# Patient Record
Sex: Female | Born: 1945 | Race: White | Hispanic: No | Smoking: Never smoker
Health system: Southern US, Community
[De-identification: ages and names within clinical notes are randomized; demographics above are authoritative.]

## PROBLEM LIST (undated history)

## (undated) DIAGNOSIS — R7303 Prediabetes: Secondary | ICD-10-CM

## (undated) DIAGNOSIS — O360190 Maternal care for anti-D [Rh] antibodies, unspecified trimester, not applicable or unspecified: Secondary | ICD-10-CM

## (undated) DIAGNOSIS — I10 Essential (primary) hypertension: Secondary | ICD-10-CM

## (undated) DIAGNOSIS — F32A Depression, unspecified: Secondary | ICD-10-CM

## (undated) DIAGNOSIS — F329 Major depressive disorder, single episode, unspecified: Secondary | ICD-10-CM

## (undated) DIAGNOSIS — B019 Varicella without complication: Secondary | ICD-10-CM

## (undated) DIAGNOSIS — E785 Hyperlipidemia, unspecified: Secondary | ICD-10-CM

## (undated) DIAGNOSIS — F419 Anxiety disorder, unspecified: Secondary | ICD-10-CM

## (undated) DIAGNOSIS — Z5189 Encounter for other specified aftercare: Secondary | ICD-10-CM

## (undated) DIAGNOSIS — B029 Zoster without complications: Secondary | ICD-10-CM

## (undated) DIAGNOSIS — K220 Achalasia of cardia: Secondary | ICD-10-CM

## (undated) DIAGNOSIS — K635 Polyp of colon: Secondary | ICD-10-CM

## (undated) DIAGNOSIS — M8580 Other specified disorders of bone density and structure, unspecified site: Secondary | ICD-10-CM

## (undated) DIAGNOSIS — K219 Gastro-esophageal reflux disease without esophagitis: Secondary | ICD-10-CM

## (undated) HISTORY — DX: Maternal care for anti-d (rh) antibodies, unspecified trimester, not applicable or unspecified: O36.0190

## (undated) HISTORY — DX: Anxiety disorder, unspecified: F41.9

## (undated) HISTORY — DX: Polyp of colon: K63.5

## (undated) HISTORY — DX: Achalasia of cardia: K22.0

## (undated) HISTORY — DX: Depression, unspecified: F32.A

## (undated) HISTORY — DX: Essential (primary) hypertension: I10

## (undated) HISTORY — DX: Encounter for other specified aftercare: Z51.89

## (undated) HISTORY — DX: Prediabetes: R73.03

## (undated) HISTORY — PX: THROAT SURGERY: SHX803

## (undated) HISTORY — DX: Gastro-esophageal reflux disease without esophagitis: K21.9

## (undated) HISTORY — DX: Other specified disorders of bone density and structure, unspecified site: M85.80

## (undated) HISTORY — DX: Zoster without complications: B02.9

## (undated) HISTORY — DX: Hyperlipidemia, unspecified: E78.5

## (undated) HISTORY — PX: WRIST SURGERY: SHX841

## (undated) HISTORY — PX: COLON SURGERY: SHX602

## (undated) HISTORY — DX: Varicella without complication: B01.9

## (undated) HISTORY — DX: Major depressive disorder, single episode, unspecified: F32.9

---

## 1998-06-29 ENCOUNTER — Other Ambulatory Visit: Admission: RE | Admit: 1998-06-29 | Discharge: 1998-06-29 | Payer: Self-pay | Admitting: Internal Medicine

## 2000-10-01 ENCOUNTER — Encounter: Payer: Self-pay | Admitting: Internal Medicine

## 2000-10-01 ENCOUNTER — Encounter: Admission: RE | Admit: 2000-10-01 | Discharge: 2000-10-01 | Payer: Self-pay | Admitting: Internal Medicine

## 2001-08-20 ENCOUNTER — Other Ambulatory Visit: Admission: RE | Admit: 2001-08-20 | Discharge: 2001-08-20 | Payer: Self-pay | Admitting: Internal Medicine

## 2012-03-14 DIAGNOSIS — H6120 Impacted cerumen, unspecified ear: Secondary | ICD-10-CM | POA: Diagnosis not present

## 2012-03-14 DIAGNOSIS — J019 Acute sinusitis, unspecified: Secondary | ICD-10-CM | POA: Diagnosis not present

## 2012-05-04 DIAGNOSIS — H2510 Age-related nuclear cataract, unspecified eye: Secondary | ICD-10-CM | POA: Diagnosis not present

## 2012-05-27 ENCOUNTER — Other Ambulatory Visit (HOSPITAL_BASED_OUTPATIENT_CLINIC_OR_DEPARTMENT_OTHER): Payer: Self-pay | Admitting: Family Medicine

## 2012-05-27 ENCOUNTER — Ambulatory Visit (HOSPITAL_BASED_OUTPATIENT_CLINIC_OR_DEPARTMENT_OTHER)
Admission: RE | Admit: 2012-05-27 | Discharge: 2012-05-27 | Disposition: A | Discharge status: Home / Self Care | Payer: Medicare Other | Source: Ambulatory Visit | Attending: Family Medicine | Admitting: Family Medicine

## 2012-05-27 DIAGNOSIS — M79609 Pain in unspecified limb: Secondary | ICD-10-CM | POA: Diagnosis not present

## 2012-05-27 DIAGNOSIS — S43499A Other sprain of unspecified shoulder joint, initial encounter: Secondary | ICD-10-CM

## 2012-05-27 DIAGNOSIS — R52 Pain, unspecified: Secondary | ICD-10-CM

## 2012-05-27 DIAGNOSIS — M25619 Stiffness of unspecified shoulder, not elsewhere classified: Secondary | ICD-10-CM | POA: Insufficient documentation

## 2012-05-27 DIAGNOSIS — S46819A Strain of other muscles, fascia and tendons at shoulder and upper arm level, unspecified arm, initial encounter: Secondary | ICD-10-CM

## 2012-05-27 DIAGNOSIS — M25519 Pain in unspecified shoulder: Secondary | ICD-10-CM | POA: Diagnosis not present

## 2012-05-28 ENCOUNTER — Ambulatory Visit (INDEPENDENT_AMBULATORY_CARE_PROVIDER_SITE_OTHER): Payer: Medicare Other | Admitting: Family Medicine

## 2012-05-28 ENCOUNTER — Encounter: Payer: Self-pay | Admitting: Family Medicine

## 2012-05-28 VITALS — BP 109/75 | HR 88 | Temp 98.4°F | Ht 62.0 in | Wt 146.0 lb

## 2012-05-28 DIAGNOSIS — M25519 Pain in unspecified shoulder: Secondary | ICD-10-CM

## 2012-05-28 DIAGNOSIS — M25512 Pain in left shoulder: Secondary | ICD-10-CM | POA: Insufficient documentation

## 2012-05-28 NOTE — Progress Notes (Signed)
  Subjective:    Patient ID: Brandyn Thien, female    DOB: 1946-02-25, 66 y.o.   MRN: [id number]  PCP: Dr. Alberteen Sam  HPI 66 yo F here for left shoulder pain.  Patient denies known injury. She states for 3-4 months she has had insidious onset with worsening left shoulder pain. Worse with any motions especially reaching behind and overhead. Pain primarily lateral upper arm/shoulder. X-rays showed moderate AC DJD but otherwise normal. Some night pain. No numbness or tingling. No neck pain. Has tried tylenol, aleve, lidoderm, piroxicam, voltaren gel. Has not tried PT, cortisone injection. No prior left shoulder issues. Is right handed.  Past Medical History  Diagnosis Date  . Hypertension   . Hyperlipidemia     Current Outpatient Prescriptions on File Prior to Visit  Medication Sig Dispense Refill  . DULoxetine (CYMBALTA) 60 MG capsule Take 60 mg by mouth daily.      Marland Kitchen ezetimibe (ZETIA) 10 MG tablet Take 10 mg by mouth daily.      Marland Kitchen lisinopril (PRINIVIL,ZESTRIL) 20 MG tablet Take 20 mg by mouth daily.        Past Surgical History  Procedure Date  . Colon surgery   . Wrist surgery   . Throat surgery     No Known Allergies  History   Social History  . Marital Status: Married    Spouse Name: N/A    Number of Children: N/A  . Years of Education: N/A   Occupational History  . Not on file.   Social History Main Topics  . Smoking status: Never Smoker   . Smokeless tobacco: Not on file  . Alcohol Use: Not on file  . Drug Use: Not on file  . Sexually Active: Not on file   Other Topics Concern  . Not on file   Social History Narrative  . No narrative on file    Family History  Problem Relation Age of Onset  . Hyperlipidemia Mother   . Hypertension Mother   . Diabetes Neg Hx   . Heart attack Neg Hx   . Sudden death Neg Hx     BP 109/75  Pulse 88  Temp(Src) 98.4 F (36.9 C) (Oral)  Ht 5\' 2"  (1.575 m)  Wt 146 lb (66.225 kg)  BMI 26.70 kg/m2  Review of  Systems See HPI above.    Objective:   Physical Exam Gen: NAD  L shoulder: No swelling, ecchymoses.  No gross deformity. No TTP at [facility] joint or biceps tendon. FROM with + painful arc. Positive Hawkins, Neers. Negative Speeds, Yergasons. Negative drop arm test. Strength 5-/5 with empty can and 5/5 with resisted internal/external rotation - pain with empty can. Negative apprehension. NV intact distally.  R shoulder: FROM without pain or weakness.    Assessment & Plan:  1. Left shoulder pain - most likely due to rotator cuff tendinopathy or partial tear.  No known injury and negative drop arm test - highly unlikely she has a complete tear.  She is scheduled for an MRI on Saturday - advised her to go ahead with this and I will call her with results on Monday.  Likely treatment options include PT +/- subacromial injection but will be dictated by MRI results.  Continue current medications in meantime, icing as needed, avoid overhead and reaching activities.

## 2012-05-28 NOTE — Assessment & Plan Note (Signed)
most likely due to rotator cuff tendinopathy or partial tear.  No known injury and negative drop arm test - highly unlikely she has a complete tear.  She is scheduled for an MRI on Saturday - advised her to go ahead with this and I will call her with results on Monday.  Likely treatment options include PT +/- subacromial injection but will be dictated by MRI results.  Continue current medications in meantime, icing as needed, avoid overhead and reaching activities.

## 2012-05-30 ENCOUNTER — Ambulatory Visit (HOSPITAL_BASED_OUTPATIENT_CLINIC_OR_DEPARTMENT_OTHER)
Admission: RE | Admit: 2012-05-30 | Discharge: 2012-05-30 | Disposition: A | Discharge status: Home / Self Care | Payer: Medicare Other | Source: Ambulatory Visit | Attending: Family Medicine | Admitting: Family Medicine

## 2012-05-30 DIAGNOSIS — M75100 Unspecified rotator cuff tear or rupture of unspecified shoulder, not specified as traumatic: Secondary | ICD-10-CM | POA: Insufficient documentation

## 2012-05-30 DIAGNOSIS — IMO0002 Reserved for concepts with insufficient information to code with codable children: Secondary | ICD-10-CM | POA: Insufficient documentation

## 2012-05-30 DIAGNOSIS — M25519 Pain in unspecified shoulder: Secondary | ICD-10-CM | POA: Insufficient documentation

## 2012-05-30 DIAGNOSIS — M25619 Stiffness of unspecified shoulder, not elsewhere classified: Secondary | ICD-10-CM | POA: Diagnosis not present

## 2012-05-30 DIAGNOSIS — R52 Pain, unspecified: Secondary | ICD-10-CM

## 2012-06-08 ENCOUNTER — Encounter: Payer: Self-pay | Admitting: Family Medicine

## 2012-06-08 ENCOUNTER — Ambulatory Visit (INDEPENDENT_AMBULATORY_CARE_PROVIDER_SITE_OTHER): Payer: Self-pay | Admitting: Family Medicine

## 2012-06-08 VITALS — BP 116/78 | HR 83 | Temp 98.5°F | Ht 63.0 in | Wt 146.0 lb

## 2012-06-08 DIAGNOSIS — M25519 Pain in unspecified shoulder: Secondary | ICD-10-CM | POA: Diagnosis not present

## 2012-06-08 DIAGNOSIS — M25512 Pain in left shoulder: Secondary | ICD-10-CM

## 2012-06-08 NOTE — Assessment & Plan Note (Signed)
2/2 rotator cuff tendinopathy and subacromial bursitis.  Subacromial injection given today and taught patient HEP with theraband and scapular stabilization exercises.  F/u in 4-6 weeks for reevaluation.  If not improving as expected can consider formal PT, nitro patches, repeat injection.

## 2012-06-08 NOTE — Progress Notes (Signed)
Subjective:    Patient ID: Kelsey Beasley, female    DOB: [birth year], 66 y.o.   MRN: [id number]  PCP: Dr. Alberteen Sam  HPI  66 yo F here for f/u left shoulder pain.  5/30: Patient denies known injury. She states for 3-4 months she has had insidious onset with worsening left shoulder pain. Worse with any motions especially reaching behind and overhead. Pain primarily lateral upper arm/shoulder. X-rays showed moderate AC DJD but otherwise normal. Some night pain. No numbness or tingling. No neck pain. Has tried tylenol, aleve, lidoderm, piroxicam, voltaren gel. Has not tried PT, cortisone injection. No prior left shoulder issues. Is right handed.  6/10: Patient returns today for subacromial cortisone injection. MRI showed rotator cuff tendinopathy and subacromial bursitis. She also would like to do a home exercise program instead of formal PT.  Past Medical History  Diagnosis Date  . Hypertension   . Hyperlipidemia     Current Outpatient Prescriptions on File Prior to Visit  Medication Sig Dispense Refill  . DULoxetine (CYMBALTA) 60 MG capsule Take 60 mg by mouth daily.      Marland Kitchen ezetimibe (ZETIA) 10 MG tablet Take 10 mg by mouth daily.      Marland Kitchen lisinopril (PRINIVIL,ZESTRIL) 20 MG tablet Take 20 mg by mouth daily.        Past Surgical History  Procedure Date  . Colon surgery   . Wrist surgery   . Throat surgery     No Known Allergies  History   Social History  . Marital Status: Married    Spouse Name: N/A    Number of Children: N/A  . Years of Education: N/A   Occupational History  . Not on file.   Social History Main Topics  . Smoking status: Never Smoker   . Smokeless tobacco: Not on file  . Alcohol Use: Not on file  . Drug Use: Not on file  . Sexually Active: Not on file   Other Topics Concern  . Not on file   Social History Narrative  . No narrative on file    Family History  Problem Relation Age of Onset  . Hyperlipidemia Mother   . Hypertension  Mother   . Diabetes Neg Hx   . Heart attack Neg Hx   . Sudden death Neg Hx     BP 116/78  Pulse 83  Temp(Src) 98.5 F (36.9 C) (Oral)  Ht 5\' 3"  (1.6 m)  Wt 146 lb (66.225 kg)  BMI 25.86 kg/m2  Review of Systems  See HPI above.    Objective:   Physical Exam ** Exam not repeated today** Gen: NAD  L shoulder: No swelling, ecchymoses.  No gross deformity. No TTP at [facility] joint or biceps tendon. FROM with + painful arc. Positive Hawkins, Neers. Negative Speeds, Yergasons. Negative drop arm test. Strength 5-/5 with empty can and 5/5 with resisted internal/external rotation - pain with empty can. Negative apprehension. NV intact distally.  R shoulder: FROM without pain or weakness.    Assessment & Plan:  1. Left shoulder pain - 2/2 rotator cuff tendinopathy and subacromial bursitis.  Subacromial injection given today and taught patient HEP with theraband and scapular stabilization exercises.  F/u in 4-6 weeks for reevaluation.  If not improving as expected can consider formal PT, nitro patches, repeat injection.  After informed written consent, patient was seated on exam table. Left shoulder was prepped with alcohol swab and utilizing posterior approach, patient's left subacromial space was injected with 3:1 marcaine: depomedrol.  Patient tolerated the procedure well without immediate complications.

## 2012-07-10 DIAGNOSIS — E785 Hyperlipidemia, unspecified: Secondary | ICD-10-CM | POA: Diagnosis not present

## 2012-07-10 DIAGNOSIS — R232 Flushing: Secondary | ICD-10-CM | POA: Diagnosis not present

## 2012-07-10 DIAGNOSIS — I10 Essential (primary) hypertension: Secondary | ICD-10-CM | POA: Diagnosis not present

## 2012-07-10 DIAGNOSIS — K219 Gastro-esophageal reflux disease without esophagitis: Secondary | ICD-10-CM | POA: Diagnosis not present

## 2012-07-13 ENCOUNTER — Ambulatory Visit (INDEPENDENT_AMBULATORY_CARE_PROVIDER_SITE_OTHER): Payer: Medicare Other | Admitting: Family Medicine

## 2012-07-13 ENCOUNTER — Encounter: Payer: Self-pay | Admitting: Family Medicine

## 2012-07-13 VITALS — BP 126/84 | HR 73 | Temp 98.0°F | Ht 63.0 in | Wt 146.0 lb

## 2012-07-13 DIAGNOSIS — M25512 Pain in left shoulder: Secondary | ICD-10-CM

## 2012-07-13 DIAGNOSIS — M25519 Pain in unspecified shoulder: Secondary | ICD-10-CM | POA: Diagnosis not present

## 2012-07-13 MED ORDER — NITROGLYCERIN 0.2 MG/HR TD PT24: MEDICATED_PATCH | TRANSDERMAL | Status: DC

## 2012-07-14 ENCOUNTER — Encounter: Payer: Self-pay | Admitting: Family Medicine

## 2012-07-14 NOTE — Progress Notes (Signed)
Subjective:    Patient ID: Kelsey Beasley, female    DOB: 11-21-1946, 66 y.o.   MRN: [id number]  PCP: Dr. Alberteen Sam  HPI  66 yo F here for f/u left shoulder pain.  5/30: Patient denies known injury. She states for 3-4 months she has had insidious onset with worsening left shoulder pain. Worse with any motions especially reaching behind and overhead. Pain primarily lateral upper arm/shoulder. X-rays showed moderate AC DJD but otherwise normal. Some night pain. No numbness or tingling. No neck pain. Has tried tylenol, aleve, lidoderm, piroxicam, voltaren gel. Has not tried PT, cortisone injection. No prior left shoulder issues. Is right handed.  6/10: Patient returns today for subacromial cortisone injection. MRI showed rotator cuff tendinopathy and subacromial bursitis. She also would like to do a home exercise program instead of formal PT.  7/16: Patient reports overall she has improved since last visit. Shot helped with her pain and motion. Pain now comes and goes, bothers if she lifts it the wrong way. Worse with overhead motions. Continues with home exercises. Taking aleve as needed.  Past Medical History  Diagnosis Date  . Hypertension   . Hyperlipidemia     Current Outpatient Prescriptions on File Prior to Visit  Medication Sig Dispense Refill  . DULoxetine (CYMBALTA) 60 MG capsule Take 60 mg by mouth daily.      Marland Kitchen ezetimibe (ZETIA) 10 MG tablet Take 10 mg by mouth daily.      Marland Kitchen lisinopril (PRINIVIL,ZESTRIL) 20 MG tablet Take 20 mg by mouth daily.      . nitroGLYCERIN (NITRODUR - DOSED IN MG/24 HR) 0.2 mg/hr Apply 1/4th of a patch to left shoulder for rotator cuff tendinopathy  - change daily  30 patch  11    Past Surgical History  Procedure Date  . Colon surgery   . Wrist surgery   . Throat surgery     No Known Allergies  History   Social History  . Marital Status: Married    Spouse Name: N/A    Number of Children: N/A  . Years of Education: N/A    Occupational History  . Not on file.   Social History Main Topics  . Smoking status: Never Smoker   . Smokeless tobacco: Not on file  . Alcohol Use: Not on file  . Drug Use: Not on file  . Sexually Active: Not on file   Other Topics Concern  . Not on file   Social History Narrative  . No narrative on file    Family History  Problem Relation Age of Onset  . Hyperlipidemia Mother   . Hypertension Mother   . Diabetes Neg Hx   . Heart attack Neg Hx   . Sudden death Neg Hx     BP 126/84  Pulse 73  Temp 98 F (36.7 C) (Oral)  Ht 5\' 3"  (1.6 m)  Wt 146 lb (66.225 kg)  BMI 25.86 kg/m2  Review of Systems  See HPI above.    Objective:   Physical Exam Gen: NAD  L shoulder: No swelling, ecchymoses.  No gross deformity. No TTP at [facility] joint or biceps tendon. FROM with + painful arc. Positive Hawkins, Neers. Negative Speeds, Yergasons. Negative drop arm test. Strength 5/5 with empty can and 5/5 with resisted internal/external rotation - pain with empty can, minimal with ext rotation. Negative apprehension. NV intact distally.  R shoulder: FROM without pain or weakness.    Assessment & Plan:  1. Left shoulder pain - 2/2  rotator cuff tendinopathy and subacromial bursitis.  Patient has improved since last visit but still has pain.  We discussed options: continue current HEP and aleve, repeat injection (I believe it's a little early to repeat this), nitro patches, formal PT.  She would like to try nitro patches - script sent in to pharmacy.  F/u in ~6 weeks for reevaluation - call if she develops a headache that causes her to stop nitro.

## 2012-07-14 NOTE — Assessment & Plan Note (Signed)
2/2 rotator cuff tendinopathy and subacromial bursitis.  Patient has improved since last visit but still has pain.  We discussed options: continue current HEP and aleve, repeat injection (I believe it's a little early to repeat this), nitro patches, formal PT.  She would like to try nitro patches - script sent in to pharmacy.  F/u in ~6 weeks for reevaluation - call if she develops a headache that causes her to stop nitro.

## 2012-08-17 ENCOUNTER — Ambulatory Visit: Payer: Medicare Other | Admitting: Family Medicine

## 2012-10-02 DIAGNOSIS — Z1231 Encounter for screening mammogram for malignant neoplasm of breast: Secondary | ICD-10-CM | POA: Diagnosis not present

## 2013-01-06 DIAGNOSIS — Z23 Encounter for immunization: Secondary | ICD-10-CM | POA: Diagnosis not present

## 2013-01-15 DIAGNOSIS — F329 Major depressive disorder, single episode, unspecified: Secondary | ICD-10-CM | POA: Diagnosis not present

## 2013-01-15 DIAGNOSIS — F3289 Other specified depressive episodes: Secondary | ICD-10-CM | POA: Diagnosis not present

## 2013-01-15 DIAGNOSIS — I10 Essential (primary) hypertension: Secondary | ICD-10-CM | POA: Diagnosis not present

## 2013-01-15 DIAGNOSIS — R4589 Other symptoms and signs involving emotional state: Secondary | ICD-10-CM | POA: Diagnosis not present

## 2013-01-15 DIAGNOSIS — E785 Hyperlipidemia, unspecified: Secondary | ICD-10-CM | POA: Diagnosis not present

## 2013-02-03 DIAGNOSIS — Z23 Encounter for immunization: Secondary | ICD-10-CM | POA: Diagnosis not present

## 2013-02-03 DIAGNOSIS — Z9189 Other specified personal risk factors, not elsewhere classified: Secondary | ICD-10-CM | POA: Diagnosis not present

## 2013-02-03 DIAGNOSIS — Z124 Encounter for screening for malignant neoplasm of cervix: Secondary | ICD-10-CM | POA: Diagnosis not present

## 2013-02-03 DIAGNOSIS — Z0000 Encounter for general adult medical examination without abnormal findings: Secondary | ICD-10-CM | POA: Diagnosis not present

## 2013-02-05 DIAGNOSIS — Z79899 Other long term (current) drug therapy: Secondary | ICD-10-CM | POA: Diagnosis not present

## 2013-02-05 DIAGNOSIS — Z0000 Encounter for general adult medical examination without abnormal findings: Secondary | ICD-10-CM | POA: Diagnosis not present

## 2013-02-05 DIAGNOSIS — E559 Vitamin D deficiency, unspecified: Secondary | ICD-10-CM | POA: Diagnosis not present

## 2013-05-06 DIAGNOSIS — H2510 Age-related nuclear cataract, unspecified eye: Secondary | ICD-10-CM | POA: Diagnosis not present

## 2013-07-05 ENCOUNTER — Other Ambulatory Visit: Payer: Self-pay | Admitting: Family Medicine

## 2013-07-20 ENCOUNTER — Other Ambulatory Visit: Payer: Self-pay | Admitting: Family Medicine

## 2013-08-10 ENCOUNTER — Other Ambulatory Visit: Payer: Self-pay | Admitting: *Deleted

## 2013-08-10 MED ORDER — DULOXETINE HCL 60 MG PO CPEP: 60.0000 mg | ORAL_CAPSULE | Freq: Every day | ORAL | Status: DC

## 2013-08-10 NOTE — Telephone Encounter (Signed)
Patient is scheduling an appt within the next 30 days for refills on her meds. PG

## 2013-09-08 ENCOUNTER — Encounter: Payer: Self-pay | Admitting: Family Medicine

## 2013-09-08 ENCOUNTER — Ambulatory Visit (INDEPENDENT_AMBULATORY_CARE_PROVIDER_SITE_OTHER): Payer: Medicare Other | Admitting: Family Medicine

## 2013-09-08 VITALS — BP 122/79 | HR 69 | Resp 16 | Wt 139.0 lb

## 2013-09-08 DIAGNOSIS — M549 Dorsalgia, unspecified: Secondary | ICD-10-CM

## 2013-09-08 DIAGNOSIS — I10 Essential (primary) hypertension: Secondary | ICD-10-CM

## 2013-09-08 DIAGNOSIS — Z23 Encounter for immunization: Secondary | ICD-10-CM

## 2013-09-08 DIAGNOSIS — F411 Generalized anxiety disorder: Secondary | ICD-10-CM

## 2013-09-08 DIAGNOSIS — E559 Vitamin D deficiency, unspecified: Secondary | ICD-10-CM

## 2013-09-08 DIAGNOSIS — K219 Gastro-esophageal reflux disease without esophagitis: Secondary | ICD-10-CM

## 2013-09-08 DIAGNOSIS — E785 Hyperlipidemia, unspecified: Secondary | ICD-10-CM | POA: Diagnosis not present

## 2013-09-08 MED ORDER — EZETIMIBE 10 MG PO TABS: 10.0000 mg | ORAL_TABLET | Freq: Every day | ORAL | Status: DC

## 2013-09-08 MED ORDER — LISINOPRIL-HYDROCHLOROTHIAZIDE 20-12.5 MG PO TABS: 1.0000 | ORAL_TABLET | Freq: Every day | ORAL | Status: DC

## 2013-09-08 MED ORDER — ESCITALOPRAM OXALATE 10 MG PO TABS: 10.0000 mg | ORAL_TABLET | Freq: Every day | ORAL | Status: DC

## 2013-09-08 MED ORDER — RABEPRAZOLE SODIUM 20 MG PO TBEC: 20.0000 mg | DELAYED_RELEASE_TABLET | Freq: Every day | ORAL | Status: DC

## 2013-09-08 MED ORDER — PRAVASTATIN SODIUM 40 MG PO TABS: 40.0000 mg | ORAL_TABLET | Freq: Every day | ORAL | Status: DC

## 2013-09-08 MED ORDER — LIDOCAINE 5 % EX PTCH: MEDICATED_PATCH | CUTANEOUS | Status: DC

## 2013-09-08 MED ORDER — VITAMIN D (ERGOCALCIFEROL) 1.25 MG (50000 UNIT) PO CAPS: 50000.0000 [IU] | ORAL_CAPSULE | ORAL | Status: AC

## 2013-09-08 NOTE — Progress Notes (Signed)
  Subjective:    Patient ID: Kelsey Beasley, female    DOB: 1946-10-08, 67 y.o.   MRN: [id number]  HPI  Kelsey Beasley is here today to discuss the conditions listed below.   1)  GERD:  She continues having acid reflux.  She takes Omeprazole 20 mg twice daily which is not helping her much.    2)  Hypertension:  She has been off her lisinopril/HCTZ 20-12.5 for several weeks.   3)  Hyperlipidemia:  She has not been taking her pravastatin.  She has been trying to eat healthier.    4)  Mood:  She continues to have struggles with her marriage.  She wants to wean herself off Cymbalta due to cost.  She would like to try a different treatment for her mood.    Review of Systems  Constitutional: Negative.   HENT: Negative.   Eyes: Negative.   Respiratory: Negative.   Cardiovascular: Negative.   Gastrointestinal: Negative.   Endocrine: Negative.   Genitourinary: Negative.   Musculoskeletal: Negative.   Skin: Negative.   Allergic/Immunologic: Negative.   Neurological: Negative.   Hematological: Negative.   Psychiatric/Behavioral: Negative.      Past Medical History  Diagnosis Date  . Hypertension   . Hyperlipidemia      Family History  Problem Relation Age of Onset  . Hyperlipidemia Mother   . Hypertension Mother   . Diabetes Neg Hx   . Heart attack Neg Hx   . Sudden death Neg Hx      History   Social History Narrative   Marital Status:  Married Marine scientist)    Children:  [id number]   Pets:  None    Living Situation: Lives with spouse, daughter and grandson.     Occupation: Optometrist   Drug Use:  None   Diet:  Regular   Exercise:  Vanetta Shawl; Walking    Hobbies:  Reading , [place], Grandchildren    [Tobacco: Never smoker]      Objective:   Physical Exam  Vitals reviewed. Constitutional: She is oriented to person, place, and time. She appears well-developed and well-nourished.  Eyes: Conjunctivae are normal. No scleral icterus.  Neck: Neck  supple. No thyromegaly present.  Cardiovascular: Normal rate, regular rhythm and normal heart sounds.   Pulmonary/Chest: Effort normal and breath sounds normal.  Musculoskeletal: She exhibits no edema and no tenderness.  Lymphadenopathy:    She has no cervical adenopathy.  Neurological: She is alert and oriented to person, place, and time.  Skin: Skin is warm and dry.  Psychiatric: She has a normal mood and affect. Her behavior is normal. Judgment and thought content normal.          Assessment & Plan:

## 2013-09-08 NOTE — Patient Instructions (Addendum)
1)  GERD - Try the Aciphex for 1 month to see if you like it better than the omepazole.  If you do let Lynnea Ferrier know and I will send it in to [facility].    2)  Knee - Voltaren Gel with Glucosamine 1500 mg per day.    Patellar Tendinitis, Jumper's Knee with Rehab Tendinitis is inflammation of a tendon. Tendonitis of the tendon below the kneecap (patella) is known as patellar tendonitis. Patellar tendonitis is a common cause of pain below the kneecap (infrapatella). Patellar tendonitis may involve a tear (strain) in the ligament. Strains are classified into three categories. Grade 1 strains cause pain, but the tendon is not lengthened. Grade 2 strains include a lengthened ligament, due to the ligament being stretched or partially ruptured. With grade 2 strains there is still function, although function may be decreased. Grade 3 strains involve a complete tear of the tendon or muscle, and function is usually impaired. Patellar tendon strains are usually grade 1 or 2.  SYMPTOMS   Pain, tenderness, swelling, warmth, or redness over the patellar tendon (just below the kneecap).  Pain and loss of strength (sometimes), with forcefully straightening the knee (especially when jumping or rising from a seated or squatting position), or bending the knee completely (squatting or kneeling).  Crackling sound (crepitation) when the tendon is moved or touched. CAUSES  Patellar tendonitis is caused by injury to the patellar tendon. The inflammation is the body's healing response. Common causes of injury include:  Stress from a sudden increase in intensity, frequency, or duration of training.  Overuse of the quadriceps thigh muscles and patellar tendon.  Direct hit (trauma) to the knee or patellar tendon. RISK INCREASES WITH:  Sports that require sudden, explosive thigh muscle (quadriceps) contraction, such as jumping, quick starts, or kicking.  Running sports, especially running down hills.  Poor  strength and flexibility of the thigh and knee.  Flat feet. PREVENTION  Warm up and stretch properly before activity.  Allow for adequate recovery between workouts.  Maintain physical fitness:  Strength, flexibility, and endurance.  Cardiovascular fitness.  Protect the knee joint with taping, protective strapping, bracing, or elastic compression bandage.  Wear arch supports (orthotics). PROGNOSIS  If treated properly, patellar tendonitis usually heals within 6 weeks.  RELATED COMPLICATIONS   Longer healing time, if not properly treated or if not given enough time to heal.  Recurring symptoms, if activity is resumed too soon, with overuse, with a direct blow, or when using poor technique.  If untreated, tendon rupture requiring surgery. TREATMENT Treatment first involves the use of ice and medicine, to reduce pain and inflammation. The use of strengthening and stretching exercises may help reduce pain with activity. These exercises may be performed at home or with a therapist. Serious cases of tendonitis may require restraining the knee for 10 to 14 days, to prevent stress on the tendon and to promote healing. Crutches may be used (uncommon) until you can walk without a limp. For cases in which non-surgical treatment is unsuccessful, surgery may be advised, to remove the inflamed tendon lining (sheath). Surgery is rare, and is only advised after at least 6 months of non-surgical treatment. MEDICATION   If pain medicine is needed, nonsteroidal anti-inflammatory medicines (aspirin and ibuprofen), or other minor pain relievers (acetaminophen), are often advised.  Do not take pain medicine for 7 days before surgery.  Prescription pain relievers may be given, if your caregiver thinks they are needed. Use only as directed and only  as much as you need. HEAT AND COLD  Cold treatment (icing) should be applied for 10 to 15 minutes every 2 to 3 hours for inflammation and pain, and  immediately after activity that aggravates your symptoms. Use ice packs or an ice massage.  Heat treatment may be used before performing stretching and strengthening activities prescribed by your caregiver, physical therapist, or athletic trainer. Use a heat pack or a warm water soak. SEEK MEDICAL CARE IF:  Symptoms get worse or do not improve in 2 weeks, despite treatment.  New, unexplained symptoms develop. (Drugs used in treatment may produce side effects.) EXERCISES RANGE OF MOTION (ROM) AND STRETCHING EXERCISES - Patellar Tendinitis (Jumper's Knee) These are some of the initial exercises with which you may start your rehabilitation program, until you see your caregiver again or until your symptoms are resolved. Remember:   Flexible tissue is more tolerant of the stresses placed on it during activities.  Each stretch should be held for 20 to 30 seconds.  A gentle stretching sensation should be felt. STRETCH Hamstrings, Supine  Lie on your back. Loop a belt or towel over the ball of your right / left foot.  Straighten your right / left knee and slowly pull on the belt to raise your leg. Do not allow the right / left knee to bend. Keep your opposite leg flat on the floor.  Raise the leg until you feel a gentle stretch behind your right / left knee or thigh. Hold this position for __________ seconds. Repeat __________ times. Complete this stretch __________ times per day.  STRETCH - Hamstrings, Doorway  Lie on your back with your right / left leg extended and resting on the wall, and the opposite leg flat on the ground through the door. At first, position your bottom farther away from the wall.  Keep your right / left knee straight. If you feel a stretch behind your knee or thigh, hold this position for __________ seconds.  If you do not feel a stretch, scoot your bottom closer to the door, and hold __________ seconds. Repeat __________ times. Complete this stretch __________ times  per day.  STRETCH - Hamstrings, Standing  Stand or sit and extend your right / left leg, placing your foot on a chair or foot stool.  Keep a slight arch in your low back and your hips straight forward.  Lead with your chest and lean forward at the waist until you feel a gentle stretch in the back of your right / left knee or thigh. (When done correctly, this exercise requires leaning only a small distance.)  Hold this position for __________ seconds. Repeat __________ times. Complete this stretch __________ times per day. STRETCH - Adductors, Lunge  While standing, spread your legs, with your right / left leg behind you.  Lean away from your right / left leg by bending your opposite knee. You may rest your hands on your thigh for balance.  You should feel a stretch in your right / left inner thigh. Hold for __________ seconds. Repeat __________ times. Complete this exercise __________ times per day.  STRENGTHENING EXERCISES - Patellar Tendinitis (Jumper's Knee) These exercises may help you when beginning to rehabilitate your injury. They may resolve your symptoms with or without further involvement from your physician, physical therapist or athletic trainer. While completing these exercises, remember:   Muscles can gain both the endurance and the strength needed for everyday activities through controlled exercises.  Complete these exercises as instructed by your  physician, physical therapist or athletic trainer. Increase the resistance and repetitions only as guided by your caregiver. STRENGTH - Quadriceps, Isometrics  Lie on your back with your right / left leg extended and your opposite knee bent.  Gradually tense the muscles in the front of your right / left thigh. You should see either your kneecap slide up toward your hip or increased dimpling just above the knee. This motion will push the back of the knee down toward the floor, mat, or bed on which you are lying.  Hold the muscle  as tight as you can, without increasing your pain, for __________ seconds.  Relax the muscles slowly and completely in between each repetition. Repeat __________ times. Complete this exercise __________ times per day.  STRENGTH - Quadriceps, Short Arcs  Lie on your back. Place a __________ inch towel roll under your right / left knee, so that the knee bends slightly.  Raise only your lower leg by tightening the muscles in the front of your thigh. Do not allow your thigh to rise.  Hold this position for __________ seconds. Repeat __________ times. Complete this exercise __________ times per day.  OPTIONAL ANKLE WEIGHTS: Begin with ____________________, but DO NOT exceed ____________________. Increase in 1 pound/ 0.5 kilogram increments. STRENGTH - Quadriceps, Straight Leg Raises  Quality counts! Watch for signs that the quadriceps muscle is working, to be sure you are strengthening the correct muscles and not "cheating" by substituting with healthier muscles.  Lay on your back with your right / left leg extended and your opposite knee bent.  Tense the muscles in the front of your right / left thigh. You should see either your kneecap slide up or increased dimpling just above the knee. Your thigh may even shake a bit.  Tighten these muscles even more and raise your leg 4 to 6 inches off the floor. Hold for __________ seconds.  Keeping these muscles tense, lower your leg.  Relax the muscles slowly and completely between each repetition. Repeat __________ times. Complete this exercise __________ times per day.  STRENGTH  Quadriceps, Squats  Stand in a door frame so that your feet and knees are in line with the frame.  Use your hands for balance, not support, on the frame.  Slowly lower your weight, bending at the hips and knees. Keep your lower legs upright so that they are parallel with the door frame. Squat only within the range that does not increase your knee pain. Never let your hips  drop below your knees.  Slowly return upright, pushing with your legs, not pulling with your hands. Repeat __________ times. Complete this exercise __________ times per day.  STRENGTH  Quadriceps, Step-Downs  Stand on the edge of a step stool or stair. Be prepared to use a countertop or wall for balance, if needed.  Keeping your right / left knee directly over the middle of your foot, slowly touch your opposite heel to the floor or lower step. Do not go all the way to the floor if your knee pain increases, just go as far as you can without increased discomfort. Use your right / left leg muscles, not gravity to lower your body weight.  Slowly push your body weight back up to the starting position, Repeat __________ times. Complete this exercise __________ times per day.  Document Released: 12/16/2005 Document Revised: 03/09/2012 Document Reviewed: 03/30/2009 [facility] Patient Information 2014 [place], [place].

## 2013-09-25 ENCOUNTER — Other Ambulatory Visit: Payer: Self-pay | Admitting: Family Medicine

## 2013-10-08 DIAGNOSIS — Z1231 Encounter for screening mammogram for malignant neoplasm of breast: Secondary | ICD-10-CM | POA: Diagnosis not present

## 2013-11-03 ENCOUNTER — Encounter: Payer: Self-pay | Admitting: *Deleted

## 2013-11-03 DIAGNOSIS — R922 Inconclusive mammogram: Secondary | ICD-10-CM | POA: Diagnosis not present

## 2013-11-07 ENCOUNTER — Encounter: Payer: Self-pay | Admitting: Family Medicine

## 2013-11-07 DIAGNOSIS — M549 Dorsalgia, unspecified: Secondary | ICD-10-CM | POA: Insufficient documentation

## 2013-11-07 DIAGNOSIS — F32A Depression, unspecified: Secondary | ICD-10-CM | POA: Insufficient documentation

## 2013-11-07 DIAGNOSIS — K219 Gastro-esophageal reflux disease without esophagitis: Secondary | ICD-10-CM | POA: Insufficient documentation

## 2013-11-07 DIAGNOSIS — I10 Essential (primary) hypertension: Secondary | ICD-10-CM | POA: Insufficient documentation

## 2013-11-07 DIAGNOSIS — F329 Major depressive disorder, single episode, unspecified: Secondary | ICD-10-CM | POA: Insufficient documentation

## 2013-11-07 DIAGNOSIS — F419 Anxiety disorder, unspecified: Secondary | ICD-10-CM | POA: Insufficient documentation

## 2013-11-07 DIAGNOSIS — Z23 Encounter for immunization: Secondary | ICD-10-CM | POA: Insufficient documentation

## 2013-11-07 DIAGNOSIS — E559 Vitamin D deficiency, unspecified: Secondary | ICD-10-CM | POA: Insufficient documentation

## 2013-11-07 DIAGNOSIS — E785 Hyperlipidemia, unspecified: Secondary | ICD-10-CM | POA: Insufficient documentation

## 2013-11-07 NOTE — Assessment & Plan Note (Signed)
The patient confirmed that they are not allergic to eggs and have never had a bad reaction with the flu shot in the past.  The vaccination was given without difficulty.   

## 2013-11-07 NOTE — Assessment & Plan Note (Signed)
She is going to try some Aciphex to see if it will work better than the omeprazole.

## 2013-11-07 NOTE — Assessment & Plan Note (Signed)
Refilled her Vitamin D.   

## 2013-11-07 NOTE — Assessment & Plan Note (Signed)
She was given a refill for the Lidoderm patch.

## 2013-11-07 NOTE — Assessment & Plan Note (Signed)
Refill her lisinopril/HCT.

## 2013-11-07 NOTE — Assessment & Plan Note (Signed)
We'll change transition her from Cymbalta to Lexapro.

## 2013-11-07 NOTE — Assessment & Plan Note (Signed)
Refilled her pravastatin and Zetia.

## 2014-01-12 ENCOUNTER — Other Ambulatory Visit: Payer: Self-pay | Admitting: *Deleted

## 2014-01-12 ENCOUNTER — Other Ambulatory Visit: Payer: Medicare Other

## 2014-01-12 DIAGNOSIS — R5383 Other fatigue: Secondary | ICD-10-CM | POA: Diagnosis not present

## 2014-01-12 DIAGNOSIS — R5381 Other malaise: Secondary | ICD-10-CM | POA: Diagnosis not present

## 2014-01-12 DIAGNOSIS — E785 Hyperlipidemia, unspecified: Secondary | ICD-10-CM | POA: Diagnosis not present

## 2014-01-13 ENCOUNTER — Other Ambulatory Visit: Payer: Medicare Other

## 2014-01-13 LAB — CBC WITH DIFFERENTIAL/PLATELET
Basophils Absolute: 0 10*3/uL (ref 0.0–0.1)
Basophils Relative: 1 % (ref 0–1)
Eosinophils Absolute: 0.2 10*3/uL (ref 0.0–0.7)
Eosinophils Relative: 3 % (ref 0–5)
HCT: 38.8 % (ref 36.0–46.0)
Hemoglobin: 13.2 g/dL (ref 12.0–15.0)
Lymphocytes Relative: 49 % — ABNORMAL HIGH (ref 12–46)
Lymphs Abs: 2.9 10*3/uL (ref 0.7–4.0)
MCH: 30.8 pg (ref 26.0–34.0)
MCHC: 34 g/dL (ref 30.0–36.0)
MCV: 90.7 fL (ref 78.0–100.0)
Monocytes Absolute: 0.7 10*3/uL (ref 0.1–1.0)
Monocytes Relative: 11 % (ref 3–12)
Neutro Abs: 2.2 10*3/uL (ref 1.7–7.7)
Neutrophils Relative %: 36 % — ABNORMAL LOW (ref 43–77)
Platelets: 333 10*3/uL (ref 150–400)
RBC: 4.28 MIL/uL (ref 3.87–5.11)
RDW: 13.7 % (ref 11.5–15.5)
WBC: 6 10*3/uL (ref 4.0–10.5)

## 2014-01-13 LAB — COMPLETE METABOLIC PANEL WITH GFR
ALT: 15 U/L (ref 0–35)
AST: 22 U/L (ref 0–37)
Albumin: 4.2 g/dL (ref 3.5–5.2)
Alkaline Phosphatase: 75 U/L (ref 39–117)
BUN: 12 mg/dL (ref 6–23)
CO2: 31 mEq/L (ref 19–32)
Calcium: 9.6 mg/dL (ref 8.4–10.5)
Chloride: 103 mEq/L (ref 96–112)
Creat: 0.78 mg/dL (ref 0.50–1.10)
GFR, Est African American: >89 mL/min
GFR, Est Non African American: 79 mL/min
Glucose, Bld: 96 mg/dL (ref 70–99)
Potassium: 4.1 mEq/L (ref 3.5–5.3)
Sodium: 141 mEq/L (ref 135–145)
Total Bilirubin: 0.5 mg/dL (ref 0.3–1.2)
Total Protein: 6.8 g/dL (ref 6.0–8.3)

## 2014-01-13 LAB — LIPID PANEL
Cholesterol: 175 mg/dL (ref 0–200)
HDL: 53 mg/dL (ref >39)
LDL Cholesterol: 97 mg/dL (ref 0–99)
Total CHOL/HDL Ratio: 3.3 Ratio
Triglycerides: 124 mg/dL (ref <150)
VLDL: 25 mg/dL (ref 0–40)

## 2014-01-14 ENCOUNTER — Other Ambulatory Visit: Payer: Medicare Other

## 2014-01-14 LAB — TSH: TSH: 4.069 u[IU]/mL (ref 0.350–4.500)

## 2014-01-20 ENCOUNTER — Encounter: Payer: Self-pay | Admitting: Family Medicine

## 2014-01-20 ENCOUNTER — Ambulatory Visit (INDEPENDENT_AMBULATORY_CARE_PROVIDER_SITE_OTHER): Payer: Medicare Other | Admitting: Family Medicine

## 2014-01-20 VITALS — BP 123/78 | HR 78 | Resp 14 | Ht 61.0 in | Wt 143.0 lb

## 2014-01-20 DIAGNOSIS — K219 Gastro-esophageal reflux disease without esophagitis: Secondary | ICD-10-CM | POA: Diagnosis not present

## 2014-01-20 DIAGNOSIS — F411 Generalized anxiety disorder: Secondary | ICD-10-CM

## 2014-01-20 DIAGNOSIS — I10 Essential (primary) hypertension: Secondary | ICD-10-CM | POA: Diagnosis not present

## 2014-01-20 DIAGNOSIS — E785 Hyperlipidemia, unspecified: Secondary | ICD-10-CM

## 2014-01-20 MED ORDER — HYDROXYZINE PAMOATE 25 MG PO CAPS: ORAL_CAPSULE | ORAL | Status: AC

## 2014-01-20 NOTE — Patient Instructions (Addendum)
1)  Mood - If you start to be more anxious since stopping the Lexapro you can either take a Vistaril occasionally as needed or get back on the Lexapro 5 mg for 1-2 weeks then decrease it further to 1/4 tab for 1-2 weeks then stop.  If you then decide you need it back then just take 1/4 tab and see if that will be the perfect dosage.     Generalized Anxiety Disorder Generalized anxiety disorder (GAD) is a mental disorder. It interferes with life functions, including relationships, work, and school. GAD is different from normal anxiety, which everyone experiences at some point in their lives in response to specific life events and activities. Normal anxiety actually helps [place] prepare for and get through these life events and activities. Normal anxiety goes away after the event or activity is over.  GAD causes anxiety that is not necessarily related to specific events or activities. It also causes excess anxiety in proportion to specific events or activities. The anxiety associated with GAD is also difficult to control. GAD can vary from mild to severe. People with severe GAD can have intense waves of anxiety with physical symptoms (panic attacks).  SYMPTOMS The anxiety and worry associated with GAD are difficult to control. This anxiety and worry are related to many life events and activities and also occur more days than not for 6 months or longer. People with GAD also have three or more of the following symptoms (one or more in children):  Restlessness.   Fatigue.  Difficulty concentrating.   Irritability.  Muscle tension.  Difficulty sleeping or unsatisfying sleep. DIAGNOSIS GAD is diagnosed through an assessment by your caregiver. Your caregiver will ask you questions aboutyour mood,physical symptoms, and events in your life. Your caregiver may ask you about your medical history and use of alcohol or drugs, including prescription medications. Your caregiver may also do a physical exam and  blood tests. Certain medical conditions and the use of certain substances can cause symptoms similar to those associated with GAD. Your caregiver may refer you to a mental health specialist for further evaluation. TREATMENT The following therapies are usually used to treat GAD:   Medication Antidepressant medication usually is prescribed for long-term daily control. Antianxiety medications may be added in severe cases, especially when panic attacks occur.   Talk therapy (psychotherapy) Certain types of talk therapy can be helpful in treating GAD by providing support, education, and guidance. A form of talk therapy called cognitive behavioral therapy can teach you healthy ways to think about and react to daily life events and activities.  Stress managementtechniques These include yoga, meditation, and exercise and can be very helpful when they are practiced regularly. A mental health specialist can help determine which treatment is best for you. Some people see improvement with one therapy. However, other people require a combination of therapies. Document Released: 04/12/2013 Document Reviewed: 04/12/2013 [facility] Patient Information 2014 [place], [place].

## 2014-01-20 NOTE — Progress Notes (Signed)
Subjective:    Patient ID: Kelsey Beasley, female    DOB: 04-19-1946, 68 y.o.   MRN: [id number]  HPI  Kelsey Beasley) is here today to follow up on her recent labs results and get medication refills.    1)  Hypertension: Her blood pressure is well controlled on the lisinopril/HCTZ.  2)  Hyperlipidemia: She is doing well on the combination of Zetia and pravastatin.    3)  GERD: She is taking the Omeprazole 20 mg BID which works well for her.  4)  Anxiety: She was taking Lexapro but stopped taking it about 4 days ago because she felt that it was making her have a lot of side effects.  She would prefer to just take the Vistaril as needed for her anxiety.     Review of Systems  Eyes:       Dry eyes  Respiratory: Positive for cough (She does not feel that this is related to lisinopril.  ).   Cardiovascular: Negative for chest pain, palpitations and leg swelling.  Gastrointestinal: Negative for abdominal pain.  Psychiatric/Behavioral: The patient is not nervous/anxious.   All other systems reviewed and are negative.    Past Medical History  Diagnosis Date  . Hypertension   . Hyperlipidemia   . GERD (gastroesophageal reflux disease)   . Anxiety   . Anti-D antibodies present   . Blood transfusion without reported diagnosis   . Achalasia   . Osteopenia   . Depression   . Dysplastic colon polyp      Past Surgical History  Procedure Laterality Date  . Colon surgery    . Wrist surgery    . Throat surgery       History   Social History Narrative   Marital Status:  Married Kelsey Beasley)    Children:  [id number]   Pets:  None    Living Situation: Lives with spouse, daughter and grandson.     Occupation: Therapist, occupational   Drug Use:  None   Diet:  Regular   Exercise:  Kelsey Beasley; Walking    Hobbies:  Reading , Bowling, Grandchildren    [Tobacco: Never smoker]     Family History  Problem Relation Age of Onset  . Hyperlipidemia Mother   .  Hypertension Mother   . Heart attack Neg Hx   . Sudden death Neg Hx   . COPD Father   . Cancer Father     lung cancer  . Lung cancer Father   . Hyperlipidemia Sister   . Hypertension Sister   . Throat cancer Brother   . Cancer Brother     throat cancer died age 3  . Diabetes Maternal Grandmother   . Heart disease Paternal Grandmother   . Heart disease Paternal Grandfather   . COPD Sister   . Alcohol abuse Sister   . Liver disease Sister   . Alcohol abuse Sister      Current Outpatient Prescriptions on File Prior to Visit  Medication Sig Dispense Refill  . ezetimibe (ZETIA) 10 MG tablet Take 1 tablet (10 mg total) by mouth daily.  90 tablet  3  . lisinopril-hydrochlorothiazide (PRINZIDE,ZESTORETIC) 20-12.5 MG per tablet Take 1 tablet by mouth daily.  90 tablet  3  . omeprazole (PRILOSEC) 20 MG capsule TAKE 1 CAPSULE TWICE DAILY  180 capsule  1  . pravastatin (PRAVACHOL) 40 MG tablet Take 1 tablet (40 mg total) by mouth daily.  90 tablet  3  .  Vitamin D, Ergocalciferol, (DRISDOL) 50000 UNITS CAPS capsule Take 1 capsule (50,000 Units total) by mouth every 7 (seven) days.  12 capsule  3  . escitalopram (LEXAPRO) 10 MG tablet Take 1 tablet (10 mg total) by mouth daily.  90 tablet  1  . nitroGLYCERIN (NITRODUR - DOSED IN MG/24 HR) 0.2 mg/hr Apply 1/4th of a patch to left shoulder for rotator cuff tendinopathy  - change daily  30 patch  11   No current facility-administered medications on file prior to visit.     No Known Allergies   Immunization History  Administered Date(s) Administered  . Influenza,inj,Quad PF,36+ Mos 09/08/2013  . Pneumococcal-Unspecified 02/03/2013  . Tdap 04/29/2007  . Zoster 01/02/2009       Objective:   Physical Exam  Vitals reviewed. Constitutional: She is oriented to person, place, and time. She appears well-developed and well-nourished.  Eyes: Conjunctivae are normal. No scleral icterus.  Neck: Neck supple. No thyromegaly present.    Cardiovascular: Normal rate, regular rhythm and normal heart sounds.   Pulmonary/Chest: Effort normal and breath sounds normal.  Musculoskeletal: She exhibits no edema and no tenderness.  Lymphadenopathy:    She has no cervical adenopathy.  Neurological: She is alert and oriented to person, place, and time.  Skin: Skin is warm and dry.  Psychiatric: She has a normal mood and affect. Her behavior is normal. Judgment and thought content normal.      Assessment & Plan:    Liticia was seen today for medication management.  Diagnoses and associated orders for this visit:  Other and unspecified hyperlipidemia Comments: Lipid panel is perfect on current medications.    Essential hypertension, benign Comments: BP remains well controlled.  We discussed how lisinopril can cause a cough.  Since she has been on it for a long time, she does not feel that her cough is related to the lisinopril.  She associates this cough with starting Lexapro.  We'll see if her cough and dry eyes improve.  If they don't, we may switch her to an ARB without a diuretic.    Anxiety state, unspecified Comments: We discussed how she should have weaned herself off of the Lexapro instead of stopping it suddenly but she says that she is feeling much better and would like to stay off of it if she can.  She took Cymbalta previously and did well on it.  She asked to be changed to something else due to the cost.  If she continues to do well, she will stay off of the Lexapro. If her anxiety worsens, she will start back on 5 mg then cut back to 2.5 mg to see how she does.    - hydrOXYzine (VISTARIL) 25 MG capsule; Take 1 capsule 1-2 times per day as needed for increased anxiety  GERD (gastroesophageal reflux disease) Comments: Symptoms are controlled on omeprazole 20 mg BID.      TIME SPENT "FACE TO FACE" WITH PATIENT -  30 MINS

## 2014-01-29 ENCOUNTER — Other Ambulatory Visit: Payer: Self-pay | Admitting: Family Medicine

## 2014-01-31 ENCOUNTER — Encounter: Payer: Self-pay | Admitting: *Deleted

## 2014-02-18 ENCOUNTER — Other Ambulatory Visit: Payer: Self-pay | Admitting: Family Medicine

## 2014-03-21 DIAGNOSIS — Z86010 Personal history of colonic polyps: Secondary | ICD-10-CM | POA: Diagnosis not present

## 2014-03-21 DIAGNOSIS — K219 Gastro-esophageal reflux disease without esophagitis: Secondary | ICD-10-CM | POA: Diagnosis not present

## 2014-04-11 DIAGNOSIS — D126 Benign neoplasm of colon, unspecified: Secondary | ICD-10-CM | POA: Diagnosis not present

## 2014-04-11 DIAGNOSIS — Z1211 Encounter for screening for malignant neoplasm of colon: Secondary | ICD-10-CM | POA: Diagnosis not present

## 2014-04-11 DIAGNOSIS — K5730 Diverticulosis of large intestine without perforation or abscess without bleeding: Secondary | ICD-10-CM | POA: Diagnosis not present

## 2014-07-27 ENCOUNTER — Other Ambulatory Visit: Payer: Self-pay | Admitting: Family Medicine

## 2014-08-11 ENCOUNTER — Encounter: Payer: Self-pay | Admitting: Physician Assistant

## 2014-08-11 ENCOUNTER — Ambulatory Visit (INDEPENDENT_AMBULATORY_CARE_PROVIDER_SITE_OTHER): Payer: Medicare Other | Admitting: Physician Assistant

## 2014-08-11 VITALS — BP 116/88 | HR 71 | Temp 98.0°F | Resp 16 | Ht 61.0 in | Wt 148.0 lb

## 2014-08-11 DIAGNOSIS — E785 Hyperlipidemia, unspecified: Secondary | ICD-10-CM | POA: Diagnosis not present

## 2014-08-11 DIAGNOSIS — F411 Generalized anxiety disorder: Secondary | ICD-10-CM

## 2014-08-11 DIAGNOSIS — K219 Gastro-esophageal reflux disease without esophagitis: Secondary | ICD-10-CM

## 2014-08-11 DIAGNOSIS — M899 Disorder of bone, unspecified: Secondary | ICD-10-CM

## 2014-08-11 DIAGNOSIS — M949 Disorder of cartilage, unspecified: Secondary | ICD-10-CM

## 2014-08-11 DIAGNOSIS — I10 Essential (primary) hypertension: Secondary | ICD-10-CM | POA: Diagnosis not present

## 2014-08-11 DIAGNOSIS — E559 Vitamin D deficiency, unspecified: Secondary | ICD-10-CM

## 2014-08-11 DIAGNOSIS — M8580 Other specified disorders of bone density and structure, unspecified site: Secondary | ICD-10-CM

## 2014-08-11 LAB — COMPREHENSIVE METABOLIC PANEL
ALT: 18 U/L (ref 0–35)
AST: 23 U/L (ref 0–37)
Albumin: 4.2 g/dL (ref 3.5–5.2)
Alkaline Phosphatase: 78 U/L (ref 39–117)
BILIRUBIN TOTAL: 0.5 mg/dL (ref 0.2–1.2)
BUN: 19 mg/dL (ref 6–23)
CHLORIDE: 104 meq/L (ref 96–112)
CO2: 28 meq/L (ref 19–32)
CREATININE: 0.86 mg/dL (ref 0.50–1.10)
Calcium: 9.6 mg/dL (ref 8.4–10.5)
Glucose, Bld: 97 mg/dL (ref 70–99)
Potassium: 4.1 mEq/L (ref 3.5–5.3)
SODIUM: 141 meq/L (ref 135–145)
TOTAL PROTEIN: 7.2 g/dL (ref 6.0–8.3)

## 2014-08-11 LAB — TSH: TSH: 3.534 u[IU]/mL (ref 0.350–4.500)

## 2014-08-11 LAB — LIPID PANEL
CHOL/HDL RATIO: 3.1 ratio
CHOLESTEROL: 168 mg/dL (ref 0–200)
HDL: 55 mg/dL (ref >39)
LDL Cholesterol: 84 mg/dL (ref 0–99)
TRIGLYCERIDES: 146 mg/dL (ref <150)
VLDL: 29 mg/dL (ref 0–40)

## 2014-08-11 MED ORDER — ESCITALOPRAM OXALATE 10 MG PO TABS: 10.0000 mg | ORAL_TABLET | Freq: Every day | ORAL | Status: DC

## 2014-08-11 MED ORDER — PRAVASTATIN SODIUM 40 MG PO TABS: 40.0000 mg | ORAL_TABLET | Freq: Every day | ORAL | Status: DC

## 2014-08-11 MED ORDER — LISINOPRIL-HYDROCHLOROTHIAZIDE 20-12.5 MG PO TABS: 1.0000 | ORAL_TABLET | Freq: Every day | ORAL | Status: DC

## 2014-08-11 MED ORDER — OMEPRAZOLE 20 MG PO CPDR: DELAYED_RELEASE_CAPSULE | ORAL | Status: DC

## 2014-08-11 MED ORDER — EZETIMIBE 10 MG PO TABS: 10.0000 mg | ORAL_TABLET | Freq: Every day | ORAL | Status: DC

## 2014-08-11 NOTE — Progress Notes (Signed)
Patient presents to clinic today to establish care.  Chronic Issues: Essential hypertension -- currently on Prinzide 20/12.5 mg tablet. Endorses good blood pressure control. BP is 116/88 in clinic. Patient is asymptomatic.  Hyperlipidemia -- patient currently on [place] and pravastatin. Denies muscle aches. Is due for lipid panel.  GERD -- well-controlled with Prilosec daily. Patient needing refill of medication. Patient also with the diagnosis of osteopenia. Needs repeat DEXA scan.  Osteopenia -- patient overdue for repeat DEXA scan. Patient currently on vitamin D 50,000 units once weekly. Need repeat vitamin D level.   Anxiety -- well-controlled with Lexapro 10 mg daily. Denies suicidal thought or ideation. Denies panic attack. Patient has been out of medication and needs refill. States she can notice a big difference off of the medication.  Health Maintenance: Dental -- overdue Vision -- up-to-date Immunizations -- up-to-date Colonoscopy -- Dr. Ivin Booty -- 2014; no abnormal findings; due in 2019 Mammogram -- 10/2013 no abnormal findings Bone Density -- Overdue; + history Osteopenia  Past Medical History  Diagnosis Date  . Hypertension   . Hyperlipidemia   . GERD (gastroesophageal reflux disease)   . Anxiety   . Anti-D antibodies present   . Blood transfusion without reported diagnosis   . Achalasia   . Osteopenia   . Depression   . Dysplastic colon polyp   . Chicken pox   . Shingles     Past Surgical History  Procedure Laterality Date  . Colon surgery    . Wrist surgery    . Throat surgery      Current Outpatient Prescriptions on File Prior to Visit  Medication Sig Dispense Refill  . nitroGLYCERIN (NITRODUR - DOSED IN MG/24 HR) 0.2 mg/hr Apply 1/4th of a patch to left shoulder for rotator cuff tendinopathy  - change daily  30 patch  11  . Vitamin D, Ergocalciferol, (DRISDOL) 50000 UNITS CAPS capsule Take 1 capsule (50,000 Units total) by mouth every 7 (seven) days.  12  capsule  3   No current facility-administered medications on file prior to visit.    No Known Allergies  Family History  Problem Relation Age of Onset  . Hyperlipidemia Mother     Living  . Hypertension Mother   . Heart attack Neg Hx   . Sudden death Neg Hx   . COPD Father   . Lung cancer Father 65    Deceased  . Hyperlipidemia Sister   . Hypertension Sister   . Throat cancer Brother   . Cancer Brother     throat cancer died age 44  . Diabetes Maternal Grandmother   . Heart disease Paternal Grandmother   . Heart disease Paternal Grandfather   . COPD Sister   . Alcohol abuse Sister     x2  . Liver disease Sister   . Bipolar disorder Daughter   . Healthy Son     x2    History   Social History  . Marital Status: Married    Spouse Name: N/A    Number of Children: N/A  . Years of Education: N/A   Occupational History  . Not on file.   Social History Main Topics  . Smoking status: Never Smoker   . Smokeless tobacco: Never Used  . Alcohol Use: No  . Drug Use: No  . Sexual Activity: Not on file   Other Topics Concern  . Not on file   Social History Narrative   Marital Status:  Married Jeneen Rinks)    Children:  [id number]   Pets:  None    Living Situation: Lives with spouse, daughter and grandson.     Occupation: Therapist, occupational   Drug Use:  None   Diet:  Regular   Exercise:  Yardwork; Walking    Hobbies:  Reading , [place], Grandchildren    [Tobacco: Never smoker]   ROS  see history of present illness. All other review of systems are negative.  BP 116/88  Pulse 71  Temp(Src) 98 F (36.7 C) (Oral)  Resp 16  Ht 5\' 1"  (1.549 m)  Wt 148 lb (67.132 kg)  BMI 27.98 kg/m2  SpO2 98%  Physical Exam  Vitals reviewed. Constitutional: She is oriented to person, place, and time and well-developed, well-nourished, and in no distress.  HENT:  Head: Normocephalic and atraumatic.  Right Ear: External ear normal.  Left Ear: External  ear normal.  Nose: Nose normal.  Mouth/Throat: Oropharynx is clear and moist. No oropharyngeal exudate.  Eyes: Conjunctivae are normal. Pupils are equal, round, and reactive to light.  Neck: Neck supple. No thyromegaly present.  Cardiovascular: Normal rate, regular rhythm, normal heart sounds and intact distal pulses.   Pulmonary/Chest: Effort normal and breath sounds normal. No respiratory distress. She has no wheezes. She has no rales. She exhibits no tenderness.  Lymphadenopathy:    She has no cervical adenopathy.  Neurological: She is alert and oriented to person, place, and time.  Skin: Skin is warm and dry. No rash noted.  Psychiatric: Affect normal.   Assessment/Plan: Essential hypertension, benign Continue current medication regimen. Will obtain CMP. Encouraged continued diet and exercise. Followup in 6 months.  GERD (gastroesophageal reflux disease) Continue current regimen. Medications refill. Avoid late-night eating and trigger foods.  Other and unspecified hyperlipidemia Will obtain fasting lipid panel and liver function testing. Continue current regimen for now. Avoid foods high in cholesterol and saturated fats. Also watch intake of alcohol and other fine sugars.  Unspecified vitamin D deficiency Patient is still on a pretty substantial dose. Stating she was on 50,000 units once daily for a month before being reduced to 50,000 units once weekly. Will obtain repeat vitamin D level and titrate dose accordingly.  Anxiety state, unspecified Refill Lexapro. Handout given for with our counselors. Followup in one month. Can also do complete physical exam at that time.  Osteopenia Patient currently on vitamin D supplements. Overdue for repeat DEXA scan. Patient is postmenopausal. 4 Place order for DEXA scan. We'll also obtain a vitamin D level.

## 2014-08-11 NOTE — Progress Notes (Signed)
Pre visit review using our clinic review tool, if applicable. No additional management support is needed unless otherwise documented below in the visit note/SLS  

## 2014-08-11 NOTE — Patient Instructions (Signed)
Please restart medications as directed.  Please obtain labs.  I will call you with your results. We will alter the Vitamin D if indicated. Please return in 4-6 weeks for follow-up of anxiety.  We can do your Wellness Exam at that visit.  Preventive Care for Adults A healthy lifestyle and preventive care can promote health and wellness. Preventive health guidelines for women include the following key practices.  A routine yearly physical is a good way to check with your health care provider about your health and preventive screening. It is a chance to share any concerns and updates on your health and to receive a thorough exam.  Visit your dentist for a routine exam and preventive care every 6 months. Brush your teeth twice a day and floss once a day. Good oral hygiene prevents tooth decay and gum disease.  The frequency of eye exams is based on your age, health, family medical history, use of contact lenses, and other factors. Follow your health care provider's recommendations for frequency of eye exams.  Eat a healthy diet. Foods like vegetables, fruits, whole grains, low-fat dairy products, and lean protein foods contain the nutrients you need without too many calories. Decrease your intake of foods high in solid fats, added sugars, and salt. Eat the right amount of calories for you.Get information about a proper diet from your health care provider, if necessary.  Regular physical exercise is one of the most important things you can do for your health. Most adults should get at least 150 minutes of moderate-intensity exercise (any activity that increases your heart rate and causes you to sweat) each week. In addition, most adults need muscle-strengthening exercises on 2 or more days a week.  Maintain a healthy weight. The body mass index (BMI) is a screening tool to identify possible weight problems. It provides an estimate of body fat based on height and weight. Your health care provider can find  your BMI and can help you achieve or maintain a healthy weight.For adults 20 years and older:  A BMI below 18.5 is considered underweight.  A BMI of 18.5 to 24.9 is normal.  A BMI of 25 to 29.9 is considered overweight.  A BMI of 30 and above is considered obese.  Maintain normal blood lipids and cholesterol levels by exercising and minimizing your intake of saturated fat. Eat a balanced diet with plenty of fruit and vegetables. Blood tests for lipids and cholesterol should begin at age 27 and be repeated every 5 years. If your lipid or cholesterol levels are high, you are over 50, or you are at high risk for heart disease, you may need your cholesterol levels checked more frequently.Ongoing high lipid and cholesterol levels should be treated with medicines if diet and exercise are not working.  If you smoke, find out from your health care provider how to quit. If you do not use tobacco, do not start.  Lung cancer screening is recommended for adults aged 69-80 years who are at high risk for developing lung cancer because of a history of smoking. A yearly low-dose CT scan of the lungs is recommended for people who have at least a 30-pack-year history of smoking and are a current smoker or have quit within the past 15 years. A pack year of smoking is smoking an average of 1 pack of cigarettes a day for 1 year (for example: 1 pack a day for 30 years or 2 packs a day for 15 years). Yearly screening should continue  until the smoker has stopped smoking for at least 15 years. Yearly screening should be stopped for people who develop a health problem that would prevent them from having lung cancer treatment.  If you are pregnant, do not drink alcohol. If you are breastfeeding, be very cautious about drinking alcohol. If you are not pregnant and choose to drink alcohol, do not have more than 1 drink per day. One drink is considered to be 12 ounces (355 mL) of beer, 5 ounces (148 mL) of wine, or 1.5 ounces  (44 mL) of liquor.  Avoid use of street drugs. Do not share needles with anyone. Ask for help if you need support or instructions about stopping the use of drugs.  High blood pressure causes heart disease and increases the risk of stroke. Your blood pressure should be checked at least every 1 to 2 years. Ongoing high blood pressure should be treated with medicines if weight loss and exercise do not work.  If you are 5-32 years old, ask your health care provider if you should take aspirin to prevent strokes.  Diabetes screening involves taking a blood sample to check your fasting blood sugar level. This should be done once every 3 years, after age 106, if you are within normal weight and without risk factors for diabetes. Testing should be considered at a younger age or be carried out more frequently if you are overweight and have at least 1 risk factor for diabetes.  Breast cancer screening is essential preventive care for women. You should practice "breast self-awareness." This means understanding the normal appearance and feel of your breasts and may include breast self-examination. Any changes detected, no matter how small, should be reported to a health care provider. Women in their 52s and 30s should have a clinical breast exam (CBE) by a health care provider as part of a regular health exam every 1 to 3 years. After age 45, women should have a CBE every year. Starting at age 63, women should consider having a mammogram (breast X-ray test) every year. Women who have a family history of breast cancer should talk to their health care provider about genetic screening. Women at a high risk of breast cancer should talk to their health care providers about having an MRI and a mammogram every year.  Breast cancer gene (BRCA)-related cancer risk assessment is recommended for women who have family members with BRCA-related cancers. BRCA-related cancers include breast, ovarian, tubal, and peritoneal cancers.  Having family members with these cancers may be associated with an increased risk for harmful changes (mutations) in the breast cancer genes BRCA1 and BRCA2. Results of the assessment will determine the need for genetic counseling and BRCA1 and BRCA2 testing.  Routine pelvic exams to screen for cancer are no longer recommended for nonpregnant women who are considered low risk for cancer of the pelvic organs (ovaries, uterus, and vagina) and who do not have symptoms. Ask your health care provider if a screening pelvic exam is right for you.  If you have had past treatment for cervical cancer or a condition that could lead to cancer, you need Pap tests and screening for cancer for at least 20 years after your treatment. If Pap tests have been discontinued, your risk factors (such as having a new sexual partner) need to be reassessed to determine if screening should be resumed. Some women have medical problems that increase the chance of getting cervical cancer. In these cases, your health care provider may recommend more frequent  screening and Pap tests.  The HPV test is an additional test that may be used for cervical cancer screening. The HPV test looks for the virus that can cause the cell changes on the cervix. The cells collected during the Pap test can be tested for HPV. The HPV test could be used to screen women aged 51 years and older, and should be used in women of any age who have unclear Pap test results. After the age of 25, women should have HPV testing at the same frequency as a Pap test.  Colorectal cancer can be detected and often prevented. Most routine colorectal cancer screening begins at the age of 58 years and continues through age 57 years. However, your health care provider may recommend screening at an earlier age if you have risk factors for colon cancer. On a yearly basis, your health care provider may provide home test kits to check for hidden blood in the stool. Use of a small  camera at the end of a tube, to directly examine the colon (sigmoidoscopy or colonoscopy), can detect the earliest forms of colorectal cancer. Talk to your health care provider about this at age 70, when routine screening begins. Direct exam of the colon should be repeated every 5-10 years through age 10 years, unless early forms of pre-cancerous polyps or small growths are found.  People who are at an increased risk for hepatitis B should be screened for this virus. You are considered at high risk for hepatitis B if:  You were born in a country where hepatitis B occurs often. Talk with your health care provider about which countries are considered high risk.  Your parents were born in a high-risk country and you have not received a shot to protect against hepatitis B (hepatitis B vaccine).  You have HIV or AIDS.  You use needles to inject street drugs.  You live with, or have sex with, someone who has hepatitis B.  You get hemodialysis treatment.  You take certain medicines for conditions like cancer, organ transplantation, and autoimmune conditions.  Hepatitis C blood testing is recommended for all people born from [birth year] through 1965 and any individual with known risks for hepatitis C.  Practice safe sex. Use condoms and avoid high-risk sexual practices to reduce the spread of sexually transmitted infections (STIs). STIs include gonorrhea, chlamydia, syphilis, trichomonas, herpes, HPV, and human immunodeficiency virus (HIV). Herpes, HIV, and HPV are viral illnesses that have no cure. They can result in disability, cancer, and death.  You should be screened for sexually transmitted illnesses (STIs) including gonorrhea and chlamydia if:  You are sexually active and are younger than 24 years.  You are older than 24 years and your health care provider tells you that you are at risk for this type of infection.  Your sexual activity has changed since you were last screened and you are at an  increased risk for chlamydia or gonorrhea. Ask your health care provider if you are at risk.  If you are at risk of being infected with HIV, it is recommended that you take a prescription medicine daily to prevent HIV infection. This is called preexposure prophylaxis (PrEP). You are considered at risk if:  You are a heterosexual woman, are sexually active, and are at increased risk for HIV infection.  You take drugs by injection.  You are sexually active with a partner who has HIV.  Talk with your health care provider about whether you are at high risk of being  infected with HIV. If you choose to begin PrEP, you should first be tested for HIV. You should then be tested every 3 months for as long as you are taking PrEP.  Osteoporosis is a disease in which the bones lose minerals and strength with aging. This can result in serious bone fractures or breaks. The risk of osteoporosis can be identified using a bone density scan. Women ages 77 years and over and women at risk for fractures or osteoporosis should discuss screening with their health care providers. Ask your health care provider whether you should take a calcium supplement or vitamin D to reduce the rate of osteoporosis.  Menopause can be associated with physical symptoms and risks. Hormone replacement therapy is available to decrease symptoms and risks. You should talk to your health care provider about whether hormone replacement therapy is right for you.  Use sunscreen. Apply sunscreen liberally and repeatedly throughout the day. You should seek shade when your shadow is shorter than you. Protect yourself by wearing long sleeves, pants, a wide-brimmed hat, and sunglasses year round, whenever you are outdoors.  Once a month, do a whole body skin exam, using a mirror to look at the skin on your back. Tell your health care provider of new moles, moles that have irregular borders, moles that are larger than a pencil eraser, or moles that have  changed in shape or color.  Stay current with required vaccines (immunizations).  Influenza vaccine. All adults should be immunized every year.  Tetanus, diphtheria, and acellular pertussis (Td, Tdap) vaccine. Pregnant women should receive 1 dose of Tdap vaccine during each pregnancy. The dose should be obtained regardless of the length of time since the last dose. Immunization is preferred during the 27th-36th week of gestation. An adult who has not previously received Tdap or who does not know her vaccine status should receive 1 dose of Tdap. This initial dose should be followed by tetanus and diphtheria toxoids (Td) booster doses every 10 years. Adults with an unknown or incomplete history of completing a 3-dose immunization series with Td-containing vaccines should begin or complete a primary immunization series including a Tdap dose. Adults should receive a Td booster every 10 years.  Varicella vaccine. An adult without evidence of immunity to varicella should receive 2 doses or a second dose if she has previously received 1 dose. Pregnant females who do not have evidence of immunity should receive the first dose after pregnancy. This first dose should be obtained before leaving the health care facility. The second dose should be obtained 4-8 weeks after the first dose.  Human papillomavirus (HPV) vaccine. Females aged 13-26 years who have not received the vaccine previously should obtain the 3-dose series. The vaccine is not recommended for use in pregnant females. However, pregnancy testing is not needed before receiving a dose. If a female is found to be pregnant after receiving a dose, no treatment is needed. In that case, the remaining doses should be delayed until after the pregnancy. Immunization is recommended for any person with an immunocompromised condition through the age of 41 years if she did not get any or all doses earlier. During the 3-dose series, the second dose should be obtained  4-8 weeks after the first dose. The third dose should be obtained 24 weeks after the first dose and 16 weeks after the second dose.  Zoster vaccine. One dose is recommended for adults aged 49 years or older unless certain conditions are present.  Measles, mumps, and rubella (  MMR) vaccine. Adults born before [birth year] generally are considered immune to measles and mumps. Adults born in [birth year] or later should have 1 or more doses of MMR vaccine unless there is a contraindication to the vaccine or there is laboratory evidence of immunity to each of the three diseases. A routine second dose of MMR vaccine should be obtained at least 28 days after the first dose for students attending postsecondary schools, health care workers, or international travelers. People who received inactivated measles vaccine or an unknown type of measles vaccine during 1963-1967 should receive 2 doses of MMR vaccine. People who received inactivated mumps vaccine or an unknown type of mumps vaccine before 1979 and are at high risk for mumps infection should consider immunization with 2 doses of MMR vaccine. For females of childbearing age, rubella immunity should be determined. If there is no evidence of immunity, females who are not pregnant should be vaccinated. If there is no evidence of immunity, females who are pregnant should delay immunization until after pregnancy. Unvaccinated health care workers born before [birth year] who lack laboratory evidence of measles, mumps, or rubella immunity or laboratory confirmation of disease should consider measles and mumps immunization with 2 doses of MMR vaccine or rubella immunization with 1 dose of MMR vaccine.  Pneumococcal 13-valent conjugate (PCV13) vaccine. When indicated, a person who is uncertain of her immunization history and has no record of immunization should receive the PCV13 vaccine. An adult aged 62 years or older who has certain medical conditions and has not been previously immunized should  receive 1 dose of PCV13 vaccine. This PCV13 should be followed with a dose of pneumococcal polysaccharide (PPSV23) vaccine. The PPSV23 vaccine dose should be obtained at least 8 weeks after the dose of PCV13 vaccine. An adult aged 28 years or older who has certain medical conditions and previously received 1 or more doses of PPSV23 vaccine should receive 1 dose of PCV13. The PCV13 vaccine dose should be obtained 1 or more years after the last PPSV23 vaccine dose.  Pneumococcal polysaccharide (PPSV23) vaccine. When PCV13 is also indicated, PCV13 should be obtained first. All adults aged 4 years and older should be immunized. An adult younger than age 33 years who has certain medical conditions should be immunized. Any person who resides in a nursing home or long-term care facility should be immunized. An adult smoker should be immunized. People with an immunocompromised condition and certain other conditions should receive both PCV13 and PPSV23 vaccines. People with human immunodeficiency virus (HIV) infection should be immunized as soon as possible after diagnosis. Immunization during chemotherapy or radiation therapy should be avoided. Routine use of PPSV23 vaccine is not recommended for American Indians, [place] Natives, or people younger than 65 years unless there are medical conditions that require PPSV23 vaccine. When indicated, people who have unknown immunization and have no record of immunization should receive PPSV23 vaccine. One-time revaccination 5 years after the first dose of PPSV23 is recommended for people aged 19-64 years who have chronic kidney failure, nephrotic syndrome, asplenia, or immunocompromised conditions. People who received 1-2 doses of PPSV23 before age 30 years should receive another dose of PPSV23 vaccine at age 59 years or later if at least 5 years have passed since the previous dose. Doses of PPSV23 are not needed for people immunized with PPSV23 at or after age 50  years.  Meningococcal vaccine. Adults with asplenia or persistent complement component deficiencies should receive 2 doses of quadrivalent meningococcal conjugate (MenACWY-D) vaccine. The doses should be  obtained at least 2 months apart. Microbiologists working with certain meningococcal bacteria, [place] recruits, people at risk during an outbreak, and people who travel to or live in countries with a high rate of meningitis should be immunized. A first-year college student up through age 3 years who is living in a residence hall should receive a dose if she did not receive a dose on or after her 16th birthday. Adults who have certain high-risk conditions should receive one or more doses of vaccine.  Hepatitis A vaccine. Adults who wish to be protected from this disease, have certain high-risk conditions, work with hepatitis A-infected animals, work in hepatitis A research labs, or travel to or work in countries with a high rate of hepatitis A should be immunized. Adults who were previously unvaccinated and who anticipate close contact with an international adoptee during the first 60 days after arrival in the [place] from a country with a high rate of hepatitis A should be immunized.  Hepatitis B vaccine. Adults who wish to be protected from this disease, have certain high-risk conditions, may be exposed to blood or other infectious body fluids, are household contacts or sex partners of hepatitis B positive people, are clients or workers in certain care facilities, or travel to or work in countries with a high rate of hepatitis B should be immunized.  Haemophilus influenzae type b (Hib) vaccine. A previously unvaccinated person with asplenia or sickle cell disease or having a scheduled splenectomy should receive 1 dose of Hib vaccine. Regardless of previous immunization, a recipient of a hematopoietic stem cell transplant should receive a 3-dose series 6-12 months after her successful transplant.  Hib vaccine is not recommended for adults with HIV infection. Preventive Services / Frequency Ages 68 to 80 years  Blood pressure check.** / Every 1 to 2 years.  Lipid and cholesterol check.** / Every 5 years beginning at age 31.  Clinical breast exam.** / Every 3 years for women in their 71s and 5s.  BRCA-related cancer risk assessment.** / For women who have family members with a BRCA-related cancer (breast, ovarian, tubal, or peritoneal cancers).  Pap test.** / Every 2 years from ages 48 through 76. Every 3 years starting at age 61 through age 78 or 72 with a history of 3 consecutive normal Pap tests.  HPV screening.** / Every 3 years from ages 77 through ages 68 to 62 with a history of 3 consecutive normal Pap tests.  Hepatitis C blood test.** / For any individual with known risks for hepatitis C.  Skin self-exam. / Monthly.  Influenza vaccine. / Every year.  Tetanus, diphtheria, and acellular pertussis (Tdap, Td) vaccine.** / Consult your health care provider. Pregnant women should receive 1 dose of Tdap vaccine during each pregnancy. 1 dose of Td every 10 years.  Varicella vaccine.** / Consult your health care provider. Pregnant females who do not have evidence of immunity should receive the first dose after pregnancy.  HPV vaccine. / 3 doses over 6 months, if 103 and younger. The vaccine is not recommended for use in pregnant females. However, pregnancy testing is not needed before receiving a dose.  Measles, mumps, rubella (MMR) vaccine.** / You need at least 1 dose of MMR if you were born in 1957 or later. You may also need a 2nd dose. For females of childbearing age, rubella immunity should be determined. If there is no evidence of immunity, females who are not pregnant should be vaccinated. If there is no evidence of immunity,  females who are pregnant should delay immunization until after pregnancy.  Pneumococcal 13-valent conjugate (PCV13) vaccine.** / Consult your health  care provider.  Pneumococcal polysaccharide (PPSV23) vaccine.** / 1 to 2 doses if you smoke cigarettes or if you have certain conditions.  Meningococcal vaccine.** / 1 dose if you are age 19 to 21 years and a first-year college student living in a residence hall, or have one of several medical conditions, you need to get vaccinated against meningococcal disease. You may also need additional booster doses.  Hepatitis A vaccine.** / Consult your health care provider.  Hepatitis B vaccine.** / Consult your health care provider.  Haemophilus influenzae type b (Hib) vaccine.** / Consult your health care provider. Ages 40 to 64 years  Blood pressure check.** / Every 1 to 2 years.  Lipid and cholesterol check.** / Every 5 years beginning at age 20 years.  Lung cancer screening. / Every year if you are aged 55-80 years and have a 30-pack-year history of smoking and currently smoke or have quit within the past 15 years. Yearly screening is stopped once you have quit smoking for at least 15 years or develop a health problem that would prevent you from having lung cancer treatment.  Clinical breast exam.** / Every year after age 40 years.  BRCA-related cancer risk assessment.** / For women who have family members with a BRCA-related cancer (breast, ovarian, tubal, or peritoneal cancers).  Mammogram.** / Every year beginning at age 40 years and continuing for as long as you are in good health. Consult with your health care provider.  Pap test.** / Every 3 years starting at age 30 years through age 65 or 70 years with a history of 3 consecutive normal Pap tests.  HPV screening.** / Every 3 years from ages 30 years through ages 65 to 70 years with a history of 3 consecutive normal Pap tests.  Fecal occult blood test (FOBT) of stool. / Every year beginning at age 50 years and continuing until age 75 years. You may not need to do this test if you get a colonoscopy every 10 years.  Flexible  sigmoidoscopy or colonoscopy.** / Every 5 years for a flexible sigmoidoscopy or every 10 years for a colonoscopy beginning at age 50 years and continuing until age 75 years.  Hepatitis C blood test.** / For all people born from 1945 through 1965 and any individual with known risks for hepatitis C.  Skin self-exam. / Monthly.  Influenza vaccine. / Every year.  Tetanus, diphtheria, and acellular pertussis (Tdap/Td) vaccine.** / Consult your health care provider. Pregnant women should receive 1 dose of Tdap vaccine during each pregnancy. 1 dose of Td every 10 years.  Varicella vaccine.** / Consult your health care provider. Pregnant females who do not have evidence of immunity should receive the first dose after pregnancy.  Zoster vaccine.** / 1 dose for adults aged 60 years or older.  Measles, mumps, rubella (MMR) vaccine.** / You need at least 1 dose of MMR if you were born in 1957 or later. You may also need a 2nd dose. For females of childbearing age, rubella immunity should be determined. If there is no evidence of immunity, females who are not pregnant should be vaccinated. If there is no evidence of immunity, females who are pregnant should delay immunization until after pregnancy.  Pneumococcal 13-valent conjugate (PCV13) vaccine.** / Consult your health care provider.  Pneumococcal polysaccharide (PPSV23) vaccine.** / 1 to 2 doses if you smoke cigarettes or if you have   certain conditions.  Meningococcal vaccine.** / Consult your health care provider.  Hepatitis A vaccine.** / Consult your health care provider.  Hepatitis B vaccine.** / Consult your health care provider.  Haemophilus influenzae type b (Hib) vaccine.** / Consult your health care provider. Ages 65 years and over  Blood pressure check.** / Every 1 to 2 years.  Lipid and cholesterol check.** / Every 5 years beginning at age 20 years.  Lung cancer screening. / Every year if you are aged 55-80 years and have a  30-pack-year history of smoking and currently smoke or have quit within the past 15 years. Yearly screening is stopped once you have quit smoking for at least 15 years or develop a health problem that would prevent you from having lung cancer treatment.  Clinical breast exam.** / Every year after age 40 years.  BRCA-related cancer risk assessment.** / For women who have family members with a BRCA-related cancer (breast, ovarian, tubal, or peritoneal cancers).  Mammogram.** / Every year beginning at age 40 years and continuing for as long as you are in good health. Consult with your health care provider.  Pap test.** / Every 3 years starting at age 30 years through age 65 or 70 years with 3 consecutive normal Pap tests. Testing can be stopped between 65 and 70 years with 3 consecutive normal Pap tests and no abnormal Pap or HPV tests in the past 10 years.  HPV screening.** / Every 3 years from ages 30 years through ages 65 or 70 years with a history of 3 consecutive normal Pap tests. Testing can be stopped between 65 and 70 years with 3 consecutive normal Pap tests and no abnormal Pap or HPV tests in the past 10 years.  Fecal occult blood test (FOBT) of stool. / Every year beginning at age 50 years and continuing until age 75 years. You may not need to do this test if you get a colonoscopy every 10 years.  Flexible sigmoidoscopy or colonoscopy.** / Every 5 years for a flexible sigmoidoscopy or every 10 years for a colonoscopy beginning at age 50 years and continuing until age 75 years.  Hepatitis C blood test.** / For all people born from 1945 through 1965 and any individual with known risks for hepatitis C.  Osteoporosis screening.** / A one-time screening for women ages 65 years and over and women at risk for fractures or osteoporosis.  Skin self-exam. / Monthly.  Influenza vaccine. / Every year.  Tetanus, diphtheria, and acellular pertussis (Tdap/Td) vaccine.** / 1 dose of Td every 10  years.  Varicella vaccine.** / Consult your health care provider.  Zoster vaccine.** / 1 dose for adults aged 60 years or older.  Pneumococcal 13-valent conjugate (PCV13) vaccine.** / Consult your health care provider.  Pneumococcal polysaccharide (PPSV23) vaccine.** / 1 dose for all adults aged 65 years and older.  Meningococcal vaccine.** / Consult your health care provider.  Hepatitis A vaccine.** / Consult your health care provider.  Hepatitis B vaccine.** / Consult your health care provider.  Haemophilus influenzae type b (Hib) vaccine.** / Consult your health care provider. ** Family history and personal history of risk and conditions may change your health care provider's recommendations. Document Released: 02/11/2002 Document Revised: 05/02/2014 Document Reviewed: 05/13/2011 [employer] Patient Information 2015 [employer]. This information is not intended to replace advice given to you by your health care provider. Make sure you discuss any questions you have with your health care provider.  

## 2014-08-12 ENCOUNTER — Telehealth: Payer: Self-pay | Admitting: Physician Assistant

## 2014-08-12 DIAGNOSIS — M8580 Other specified disorders of bone density and structure, unspecified site: Secondary | ICD-10-CM | POA: Insufficient documentation

## 2014-08-12 NOTE — Assessment & Plan Note (Signed)
Continue current medication regimen. Will obtain CMP. Encouraged continued diet and exercise. Followup in 6 months.

## 2014-08-12 NOTE — Assessment & Plan Note (Signed)
Patient is still on a pretty substantial dose. Stating she was on 50,000 units once daily for a month before being reduced to 50,000 units once weekly. Will obtain repeat vitamin D level and titrate dose accordingly.

## 2014-08-12 NOTE — Assessment & Plan Note (Signed)
Will obtain fasting lipid panel and liver function testing. Continue current regimen for now. Avoid foods high in cholesterol and saturated fats. Also watch intake of alcohol and other fine sugars.

## 2014-08-12 NOTE — Telephone Encounter (Signed)
Relevant patient education assigned to patient using Emmi. ° °

## 2014-08-12 NOTE — Assessment & Plan Note (Signed)
Patient currently on vitamin D supplements. Overdue for repeat DEXA scan. Patient is postmenopausal. 4 Place order for DEXA scan. We'll also obtain a vitamin D level.

## 2014-08-12 NOTE — Assessment & Plan Note (Signed)
Continue current regimen. Medications refill. Avoid late-night eating and trigger foods.

## 2014-08-12 NOTE — Assessment & Plan Note (Signed)
Refill Lexapro. Handout given for with our counselors. Followup in one month. Can also do complete physical exam at that time.

## 2014-08-16 LAB — VITAMIN D 1,25 DIHYDROXY
VITAMIN D2 1, 25 (OH): 42 pg/mL
Vitamin D 1, 25 (OH)2 Total: 42 pg/mL (ref 18–72)
Vitamin D3 1, 25 (OH)2: <8 pg/mL

## 2014-09-07 ENCOUNTER — Ambulatory Visit: Payer: Medicare Other | Admitting: Physician Assistant

## 2014-09-12 ENCOUNTER — Encounter: Payer: Self-pay | Admitting: Physician Assistant

## 2014-09-12 ENCOUNTER — Ambulatory Visit (INDEPENDENT_AMBULATORY_CARE_PROVIDER_SITE_OTHER): Payer: Medicare Other | Admitting: Physician Assistant

## 2014-09-12 VITALS — BP 124/81 | HR 77 | Temp 98.6°F | Resp 16 | Ht 61.0 in | Wt 146.4 lb

## 2014-09-12 DIAGNOSIS — Z23 Encounter for immunization: Secondary | ICD-10-CM

## 2014-09-12 DIAGNOSIS — F411 Generalized anxiety disorder: Secondary | ICD-10-CM | POA: Diagnosis not present

## 2014-09-12 MED ORDER — ESCITALOPRAM OXALATE 10 MG PO TABS: 10.0000 mg | ORAL_TABLET | Freq: Every day | ORAL | Status: DC

## 2014-09-12 NOTE — Progress Notes (Signed)
Pre visit review using our clinic review tool, if applicable. No additional management support is needed unless otherwise documented below in the visit note/SLS  

## 2014-09-12 NOTE — Assessment & Plan Note (Signed)
Doing well.  Continue Lexapro 10 mg daily.  Follow-up in 6 months.

## 2014-09-12 NOTE — Progress Notes (Signed)
Patient presents to clinic today for 31-month follow-up of anxiety.  Patient was restarted on Lexapro 10 mg at last visit.  Patient endorses improvement in anxiety levels.  Is getting more restful sleep at night.  Patient denies SI/HI.   Patient requests flu shot today.  Will be given by nursing staff.  Past Medical History  Diagnosis Date  . Hypertension   . Hyperlipidemia   . GERD (gastroesophageal reflux disease)   . Anxiety   . Anti-D antibodies present   . Blood transfusion without reported diagnosis   . Achalasia   . Osteopenia   . Depression   . Dysplastic colon polyp   . Chicken pox   . Shingles     Current Outpatient Prescriptions on File Prior to Visit  Medication Sig Dispense Refill  . ezetimibe (ZETIA) 10 MG tablet Take 1 tablet (10 mg total) by mouth daily.  90 tablet  3  . lisinopril-hydrochlorothiazide (PRINZIDE,ZESTORETIC) 20-12.5 MG per tablet Take 1 tablet by mouth daily.  90 tablet  3  . nitroGLYCERIN (NITRODUR - DOSED IN MG/24 HR) 0.2 mg/hr Apply 1/4th of a patch to left shoulder for rotator cuff tendinopathy  - change daily  30 patch  11  . omeprazole (PRILOSEC) 20 MG capsule TAKE 1 CAPSULE TWICE DAILY  180 capsule  1  . pravastatin (PRAVACHOL) 40 MG tablet Take 1 tablet (40 mg total) by mouth daily.  90 tablet  3   No current facility-administered medications on file prior to visit.    No Known Allergies  Family History  Problem Relation Age of Onset  . Hyperlipidemia Mother     Living  . Hypertension Mother   . Heart attack Neg Hx   . Sudden death Neg Hx   . COPD Father   . Lung cancer Father 40    Deceased  . Hyperlipidemia Sister   . Hypertension Sister   . Throat cancer Brother   . Cancer Brother     throat cancer died age 63  . Diabetes Maternal Grandmother   . Heart disease Paternal Grandmother   . Heart disease Paternal Grandfather   . COPD Sister   . Alcohol abuse Sister     x2  . Liver disease Sister   . Bipolar disorder Daughter    . Healthy Son     x2    History   Social History  . Marital Status: Married    Spouse Name: N/A    Number of Children: N/A  . Years of Education: N/A   Social History Main Topics  . Smoking status: Never Smoker   . Smokeless tobacco: Never Used  . Alcohol Use: No  . Drug Use: No  . Sexual Activity: None   Other Topics Concern  . None   Social History Narrative   Marital Status:  Married Jeneen Rinks)    Children:  G3 [id number]   Pets:  None    Living Situation: Lives with spouse, daughter and grandson.     Occupation: Therapist, occupational   Drug Use:  None   Diet:  Regular   Exercise:  Zettie Pho; Walking    Hobbies:  Reading , Bowling, Grandchildren    [Tobacco: Never smoker]   Review of Systems - See HPI.  All other ROS are negative.  BP 124/81  Pulse 77  Temp(Src) 98.6 F (37 C) (Oral)  Resp 16  Ht 5\' 1"  (1.549 m)  Wt 146 lb 6 oz (66.395 kg)  BMI 27.67 kg/m2  SpO2 97%  Physical Exam  Vitals reviewed. Constitutional: She is oriented to person, place, and time and well-developed, well-nourished, and in no distress.  HENT:  Head: Normocephalic and atraumatic.  Eyes: Conjunctivae are normal. Pupils are equal, round, and reactive to light.  Neck: Neck supple.  Cardiovascular: Normal rate, regular rhythm, normal heart sounds and intact distal pulses.   Pulmonary/Chest: Effort normal. No respiratory distress. She has no wheezes. She has no rales. She exhibits no tenderness.  Neurological: She is alert and oriented to person, place, and time.  Skin: Skin is warm and dry.  Psychiatric: Affect normal.    Recent Results (from the past 2160 hour(s))  COMPREHENSIVE METABOLIC PANEL     Status: None   Collection Time    08/11/14  9:21 AM      Result Value Ref Range   Sodium 141  135 - 145 mEq/L   Potassium 4.1  3.5 - 5.3 mEq/L   Chloride 104  96 - 112 mEq/L   CO2 28  19 - 32 mEq/L   Glucose, Bld 97  70 - 99 mg/dL   BUN 19  6 - 23 mg/dL   Creat  0.86  0.50 - 1.10 mg/dL   Total Bilirubin 0.5  0.2 - 1.2 mg/dL   Alkaline Phosphatase 78  39 - 117 U/L   AST 23  0 - 37 U/L   ALT 18  0 - 35 U/L   Total Protein 7.2  6.0 - 8.3 g/dL   Albumin 4.2  3.5 - 5.2 g/dL   Calcium 9.6  8.4 - 10.5 mg/dL  TSH     Status: None   Collection Time    08/11/14  9:21 AM      Result Value Ref Range   TSH 3.534  0.350 - 4.500 uIU/mL  VITAMIN D 1,25 DIHYDROXY     Status: None   Collection Time    08/11/14  9:21 AM      Result Value Ref Range   Vitamin D 1, 25 (OH)2 Total 42  18 - 72 pg/mL   Vitamin D3 1, 25 (OH)2 <8     Vitamin D2 1, 25 (OH)2 42     Comment: Vitamin D3, 1,25(OH)2 indicates both endogenous     production and supplementation.  Vitamin D2, 1,25(OH)2     is an indicator of exogeous sources, such as diet or     supplementation.  Interpretation and therapy are based     on measurement of Vitamin D,1,25(OH)2, Total.     This test was developed and its performance     characteristics have been determined by [facility], [place], [place].     Performance characteristics refer to the     analytical performance of the test.  LIPID PANEL     Status: None   Collection Time    08/11/14  9:21 AM      Result Value Ref Range   Cholesterol 168  0 - 200 mg/dL   Comment: ATP III Classification:           < 200        mg/dL        Desirable          200 - 239     mg/dL        Borderline High          >= 240  mg/dL        High         Triglycerides 146  <150 mg/dL   HDL 55  >39 mg/dL   Total CHOL/HDL Ratio 3.1     VLDL 29  0 - 40 mg/dL   LDL Cholesterol 84  0 - 99 mg/dL   Comment:       Total Cholesterol/HDL Ratio:CHD Risk                            Coronary Heart Disease Risk Table                                            Men       Women              1/2 Average Risk              3.4        3.3                  Average Risk              5.0        4.4               2X Average Risk              9.6        7.1                3X Average Risk             23.4       11.0     Use the calculated Patient Ratio above and the CHD Risk table      to determine the patient's CHD Risk.     ATP III Classification (LDL):           < 100        mg/dL         Optimal          100 - 129     mg/dL         Near or Above Optimal          130 - 159     mg/dL         Borderline High          160 - 189     mg/dL         High           > 190        mg/dL         Very High          Assessment/Plan: Anxiety state, unspecified Doing well.  Continue Lexapro 10 mg daily.  Follow-up in 6 months.

## 2014-09-12 NOTE — Patient Instructions (Signed)
I am glad you are doing well.  Please continue Lexapro as directed.  I have sent in a 30-day supply to your local pharmacy and a 90-day supply to your mail order pharmacy with additional refills.  Please follow-up with me in 6 months as directed for your other medical issues.  Return sooner if needed.  I hope everything goes well for your mother!

## 2014-10-17 DIAGNOSIS — J042 Acute laryngotracheitis: Secondary | ICD-10-CM | POA: Diagnosis not present

## 2014-10-18 ENCOUNTER — Ambulatory Visit: Payer: Medicare Other | Admitting: Physician Assistant

## 2014-11-14 DIAGNOSIS — Z1231 Encounter for screening mammogram for malignant neoplasm of breast: Secondary | ICD-10-CM | POA: Diagnosis not present

## 2014-12-07 DIAGNOSIS — H524 Presbyopia: Secondary | ICD-10-CM | POA: Diagnosis not present

## 2014-12-07 DIAGNOSIS — H2513 Age-related nuclear cataract, bilateral: Secondary | ICD-10-CM | POA: Diagnosis not present

## 2015-01-02 ENCOUNTER — Other Ambulatory Visit: Payer: Self-pay | Admitting: Physician Assistant

## 2015-01-02 NOTE — Telephone Encounter (Signed)
Medication Detail      Disp Refills Start End     omeprazole (PRILOSEC) 20 MG capsule 180 capsule 1 08/11/2014     Sig: TAKE 1 CAPSULE TWICE DAILY    E-Prescribing Status: Receipt confirmed by pharmacy (08/11/2014 9:14 AM EDT)     Pharmacy    [facility] [place], [place]    Follow-up and Disposition  LAST OV: 09.14.15   Return in about 6 months (around 03/13/2015).   HOLD Rx Until Time to Fill/SLS

## 2015-02-03 MED ORDER — OMEPRAZOLE 20 MG PO CPDR: 20.0000 mg | DELAYED_RELEASE_CAPSULE | Freq: Two times a day (BID) | ORAL | Status: DC

## 2015-02-03 NOTE — Telephone Encounter (Signed)
Rx request to pharmacy/SLS  

## 2015-02-03 NOTE — Addendum Note (Signed)
Addended by: Rockwell Germany on: 02/03/2015 03:57 PM   Modules accepted: Orders

## 2015-02-03 NOTE — Telephone Encounter (Signed)
Rx resent to pharmacy/SLS

## 2015-04-04 ENCOUNTER — Ambulatory Visit (INDEPENDENT_AMBULATORY_CARE_PROVIDER_SITE_OTHER): Payer: Medicare Other | Admitting: Physician Assistant

## 2015-04-04 ENCOUNTER — Encounter: Payer: Self-pay | Admitting: Physician Assistant

## 2015-04-04 VITALS — HR 70 | Temp 98.4°F | Resp 16 | Ht 61.0 in | Wt 142.2 lb

## 2015-04-04 DIAGNOSIS — Z136 Encounter for screening for cardiovascular disorders: Secondary | ICD-10-CM

## 2015-04-04 DIAGNOSIS — F411 Generalized anxiety disorder: Secondary | ICD-10-CM | POA: Diagnosis not present

## 2015-04-04 DIAGNOSIS — Z0000 Encounter for general adult medical examination without abnormal findings: Secondary | ICD-10-CM

## 2015-04-04 DIAGNOSIS — E785 Hyperlipidemia, unspecified: Secondary | ICD-10-CM

## 2015-04-04 DIAGNOSIS — I10 Essential (primary) hypertension: Secondary | ICD-10-CM

## 2015-04-04 DIAGNOSIS — Z23 Encounter for immunization: Secondary | ICD-10-CM

## 2015-04-04 DIAGNOSIS — R8290 Unspecified abnormal findings in urine: Secondary | ICD-10-CM

## 2015-04-04 DIAGNOSIS — M8580 Other specified disorders of bone density and structure, unspecified site: Secondary | ICD-10-CM

## 2015-04-04 DIAGNOSIS — K219 Gastro-esophageal reflux disease without esophagitis: Secondary | ICD-10-CM

## 2015-04-04 LAB — URINALYSIS, ROUTINE W REFLEX MICROSCOPIC
BILIRUBIN URINE: NEGATIVE
KETONES UR: NEGATIVE
Nitrite: NEGATIVE
PH: 8.5 — AB (ref 5.0–8.0)
Specific Gravity, Urine: 1.015 (ref 1.000–1.030)
UROBILINOGEN UA: 0.2 (ref 0.0–1.0)
Urine Glucose: NEGATIVE

## 2015-04-04 LAB — CBC
HEMATOCRIT: 42.1 % (ref 36.0–46.0)
Hemoglobin: 14.1 g/dL (ref 12.0–15.0)
MCHC: 33.6 g/dL (ref 30.0–36.0)
MCV: 91.8 fl (ref 78.0–100.0)
Platelets: 285 10*3/uL (ref 150.0–400.0)
RBC: 4.59 Mil/uL (ref 3.87–5.11)
RDW: 14.4 % (ref 11.5–15.5)
WBC: 5 10*3/uL (ref 4.0–10.5)

## 2015-04-04 LAB — HEPATIC FUNCTION PANEL
ALBUMIN: 4.2 g/dL (ref 3.5–5.2)
ALT: 16 U/L (ref 0–35)
AST: 22 U/L (ref 0–37)
Alkaline Phosphatase: 77 U/L (ref 39–117)
Bilirubin, Direct: 0.1 mg/dL (ref 0.0–0.3)
Total Bilirubin: 0.6 mg/dL (ref 0.2–1.2)
Total Protein: 7.2 g/dL (ref 6.0–8.3)

## 2015-04-04 LAB — BASIC METABOLIC PANEL
BUN: 16 mg/dL (ref 6–23)
CO2: 32 mEq/L (ref 19–32)
Calcium: 9.9 mg/dL (ref 8.4–10.5)
Chloride: 101 mEq/L (ref 96–112)
Creatinine, Ser: 0.85 mg/dL (ref 0.40–1.20)
GFR: 70.68 mL/min (ref >60.00)
Glucose, Bld: 104 mg/dL — ABNORMAL HIGH (ref 70–99)
POTASSIUM: 4.2 meq/L (ref 3.5–5.1)
Sodium: 138 mEq/L (ref 135–145)

## 2015-04-04 LAB — LIPID PANEL
CHOLESTEROL: 156 mg/dL (ref 0–200)
HDL: 50.6 mg/dL (ref >39.00)
LDL Cholesterol: 87 mg/dL (ref 0–99)
NONHDL: 105.4
TRIGLYCERIDES: 92 mg/dL (ref 0.0–149.0)
Total CHOL/HDL Ratio: 3
VLDL: 18.4 mg/dL (ref 0.0–40.0)

## 2015-04-04 MED ORDER — EZETIMIBE 10 MG PO TABS: 10.0000 mg | ORAL_TABLET | Freq: Every day | ORAL | Status: DC

## 2015-04-04 MED ORDER — OMEPRAZOLE 20 MG PO CPDR: 20.0000 mg | DELAYED_RELEASE_CAPSULE | Freq: Two times a day (BID) | ORAL | Status: DC

## 2015-04-04 MED ORDER — ESCITALOPRAM OXALATE 20 MG PO TABS: 20.0000 mg | ORAL_TABLET | Freq: Every day | ORAL | Status: DC

## 2015-04-04 MED ORDER — ESCITALOPRAM OXALATE 10 MG PO TABS: 10.0000 mg | ORAL_TABLET | Freq: Every day | ORAL | Status: DC

## 2015-04-04 MED ORDER — PNEUMOCOCCAL 13-VAL CONJ VACC IM SUSP
0.5000 mL | Freq: Once | INTRAMUSCULAR | Status: AC
  Administered 2015-04-04: 0.5 mL via INTRAMUSCULAR

## 2015-04-04 MED ORDER — PRAVASTATIN SODIUM 40 MG PO TABS: 40.0000 mg | ORAL_TABLET | Freq: Every day | ORAL | Status: DC

## 2015-04-04 MED ORDER — FLUTICASONE PROPIONATE 50 MCG/ACT NA SUSP: 2.0000 | Freq: Every day | NASAL | Status: DC

## 2015-04-04 MED ORDER — NITROGLYCERIN 0.2 MG/HR TD PT24
MEDICATED_PATCH | TRANSDERMAL | Status: DC
Start: 2015-04-04 — End: 2017-06-09

## 2015-04-04 MED ORDER — LISINOPRIL-HYDROCHLOROTHIAZIDE 20-12.5 MG PO TABS: 1.0000 | ORAL_TABLET | Freq: Every day | ORAL | Status: DC

## 2015-04-04 MED ORDER — RANITIDINE HCL 150 MG PO CAPS: 150.0000 mg | ORAL_CAPSULE | Freq: Every evening | ORAL | Status: DC

## 2015-04-04 NOTE — Patient Instructions (Signed)
Please stop by the lab for blood work. I will call you with your results.  Continue your medications with the following changes: (1) We have increased the Lexapro to 20 mg daily. (2) Take Omeprazole once daily.  Use the Zantac at bedtime. (3) Start the Flonase daily as directed (This is at your local pharmacy).  Follow-up in 1 month.  Preventive Care for Adults A healthy lifestyle and preventive care can promote health and wellness. Preventive health guidelines for women include the following key practices.  A routine yearly physical is a good way to check with your health care provider about your health and preventive screening. It is a chance to share any concerns and updates on your health and to receive a thorough exam.  Visit your dentist for a routine exam and preventive care every 6 months. Brush your teeth twice a day and floss once a day. Good oral hygiene prevents tooth decay and gum disease.  The frequency of eye exams is based on your age, health, family medical history, use of contact lenses, and other factors. Follow your health care provider's recommendations for frequency of eye exams.  Eat a healthy diet. Foods like vegetables, fruits, whole grains, low-fat dairy products, and lean protein foods contain the nutrients you need without too many calories. Decrease your intake of foods high in solid fats, added sugars, and salt. Eat the right amount of calories for you.Get information about a proper diet from your health care provider, if necessary.  Regular physical exercise is one of the most important things you can do for your health. Most adults should get at least 150 minutes of moderate-intensity exercise (any activity that increases your heart rate and causes you to sweat) each week. In addition, most adults need muscle-strengthening exercises on 2 or more days a week.  Maintain a healthy weight. The body mass index (BMI) is a screening tool to identify possible weight  problems. It provides an estimate of body fat based on height and weight. Your health care provider can find your BMI and can help you achieve or maintain a healthy weight.For adults 20 years and older:  A BMI below 18.5 is considered underweight.  A BMI of 18.5 to 24.9 is normal.  A BMI of 25 to 29.9 is considered overweight.  A BMI of 30 and above is considered obese.  Maintain normal blood lipids and cholesterol levels by exercising and minimizing your intake of saturated fat. Eat a balanced diet with plenty of fruit and vegetables. Blood tests for lipids and cholesterol should begin at age 48 and be repeated every 5 years. If your lipid or cholesterol levels are high, you are over 50, or you are at high risk for heart disease, you may need your cholesterol levels checked more frequently.Ongoing high lipid and cholesterol levels should be treated with medicines if diet and exercise are not working.  If you smoke, find out from your health care provider how to quit. If you do not use tobacco, do not start.  Lung cancer screening is recommended for adults aged 24-80 years who are at high risk for developing lung cancer because of a history of smoking. A yearly low-dose CT scan of the lungs is recommended for people who have at least a 30-pack-year history of smoking and are a current smoker or have quit within the past 15 years. A pack year of smoking is smoking an average of 1 pack of cigarettes a day for 1 year (for example: 1  pack a day for 30 years or 2 packs a day for 15 years). Yearly screening should continue until the smoker has stopped smoking for at least 15 years. Yearly screening should be stopped for people who develop a health problem that would prevent them from having lung cancer treatment.  If you are pregnant, do not drink alcohol. If you are breastfeeding, be very cautious about drinking alcohol. If you are not pregnant and choose to drink alcohol, do not have more than 1 drink  per day. One drink is considered to be 12 ounces (355 mL) of beer, 5 ounces (148 mL) of wine, or 1.5 ounces (44 mL) of liquor.  Avoid use of street drugs. Do not share needles with anyone. Ask for help if you need support or instructions about stopping the use of drugs.  High blood pressure causes heart disease and increases the risk of stroke. Your blood pressure should be checked at least every 1 to 2 years. Ongoing high blood pressure should be treated with medicines if weight loss and exercise do not work.  If you are 64-61 years old, ask your health care provider if you should take aspirin to prevent strokes.  Diabetes screening involves taking a blood sample to check your fasting blood sugar level. This should be done once every 3 years, after age 69, if you are within normal weight and without risk factors for diabetes. Testing should be considered at a younger age or be carried out more frequently if you are overweight and have at least 1 risk factor for diabetes.  Breast cancer screening is essential preventive care for women. You should practice "breast self-awareness." This means understanding the normal appearance and feel of your breasts and may include breast self-examination. Any changes detected, no matter how small, should be reported to a health care provider. Women in their 78s and 30s should have a clinical breast exam (CBE) by a health care provider as part of a regular health exam every 1 to 3 years. After age 24, women should have a CBE every year. Starting at age 37, women should consider having a mammogram (breast X-ray test) every year. Women who have a family history of breast cancer should talk to their health care provider about genetic screening. Women at a high risk of breast cancer should talk to their health care providers about having an MRI and a mammogram every year.  Breast cancer gene (BRCA)-related cancer risk assessment is recommended for women who have family  members with BRCA-related cancers. BRCA-related cancers include breast, ovarian, tubal, and peritoneal cancers. Having family members with these cancers may be associated with an increased risk for harmful changes (mutations) in the breast cancer genes BRCA1 and BRCA2. Results of the assessment will determine the need for genetic counseling and BRCA1 and BRCA2 testing.  Routine pelvic exams to screen for cancer are no longer recommended for nonpregnant women who are considered low risk for cancer of the pelvic organs (ovaries, uterus, and vagina) and who do not have symptoms. Ask your health care provider if a screening pelvic exam is right for you.  If you have had past treatment for cervical cancer or a condition that could lead to cancer, you need Pap tests and screening for cancer for at least 20 years after your treatment. If Pap tests have been discontinued, your risk factors (such as having a new sexual partner) need to be reassessed to determine if screening should be resumed. Some women have medical problems that  increase the chance of getting cervical cancer. In these cases, your health care provider may recommend more frequent screening and Pap tests.  The HPV test is an additional test that may be used for cervical cancer screening. The HPV test looks for the virus that can cause the cell changes on the cervix. The cells collected during the Pap test can be tested for HPV. The HPV test could be used to screen women aged 50 years and older, and should be used in women of any age who have unclear Pap test results. After the age of 67, women should have HPV testing at the same frequency as a Pap test.  Colorectal cancer can be detected and often prevented. Most routine colorectal cancer screening begins at the age of 53 years and continues through age 23 years. However, your health care provider may recommend screening at an earlier age if you have risk factors for colon cancer. On a yearly basis,  your health care provider may provide home test kits to check for hidden blood in the stool. Use of a small camera at the end of a tube, to directly examine the colon (sigmoidoscopy or colonoscopy), can detect the earliest forms of colorectal cancer. Talk to your health care provider about this at age 81, when routine screening begins. Direct exam of the colon should be repeated every 5-10 years through age 50 years, unless early forms of pre-cancerous polyps or small growths are found.  People who are at an increased risk for hepatitis B should be screened for this virus. You are considered at high risk for hepatitis B if:  You were born in a country where hepatitis B occurs often. Talk with your health care provider about which countries are considered high risk.  Your parents were born in a high-risk country and you have not received a shot to protect against hepatitis B (hepatitis B vaccine).  You have HIV or AIDS.  You use needles to inject street drugs.  You live with, or have sex with, someone who has hepatitis B.  You get hemodialysis treatment.  You take certain medicines for conditions like cancer, organ transplantation, and autoimmune conditions.  Hepatitis C blood testing is recommended for all people born from [birth year] through 1965 and any individual with known risks for hepatitis C.  Practice safe sex. Use condoms and avoid high-risk sexual practices to reduce the spread of sexually transmitted infections (STIs). STIs include gonorrhea, chlamydia, syphilis, trichomonas, herpes, HPV, and human immunodeficiency virus (HIV). Herpes, HIV, and HPV are viral illnesses that have no cure. They can result in disability, cancer, and death.  You should be screened for sexually transmitted illnesses (STIs) including gonorrhea and chlamydia if:  You are sexually active and are younger than 24 years.  You are older than 24 years and your health care provider tells you that you are at risk for  this type of infection.  Your sexual activity has changed since you were last screened and you are at an increased risk for chlamydia or gonorrhea. Ask your health care provider if you are at risk.  If you are at risk of being infected with HIV, it is recommended that you take a prescription medicine daily to prevent HIV infection. This is called preexposure prophylaxis (PrEP). You are considered at risk if:  You are a heterosexual woman, are sexually active, and are at increased risk for HIV infection.  You take drugs by injection.  You are sexually active with a partner who  has HIV.  Talk with your health care provider about whether you are at high risk of being infected with HIV. If you choose to begin PrEP, you should first be tested for HIV. You should then be tested every 3 months for as long as you are taking PrEP.  Osteoporosis is a disease in which the bones lose minerals and strength with aging. This can result in serious bone fractures or breaks. The risk of osteoporosis can be identified using a bone density scan. Women ages 5 years and over and women at risk for fractures or osteoporosis should discuss screening with their health care providers. Ask your health care provider whether you should take a calcium supplement or vitamin D to reduce the rate of osteoporosis.  Menopause can be associated with physical symptoms and risks. Hormone replacement therapy is available to decrease symptoms and risks. You should talk to your health care provider about whether hormone replacement therapy is right for you.  Use sunscreen. Apply sunscreen liberally and repeatedly throughout the day. You should seek shade when your shadow is shorter than you. Protect yourself by wearing long sleeves, pants, a wide-brimmed hat, and sunglasses year round, whenever you are outdoors.  Once a month, do a whole body skin exam, using a mirror to look at the skin on your back. Tell your health care provider of  new moles, moles that have irregular borders, moles that are larger than a pencil eraser, or moles that have changed in shape or color.  Stay current with required vaccines (immunizations).  Influenza vaccine. All adults should be immunized every year.  Tetanus, diphtheria, and acellular pertussis (Td, Tdap) vaccine. Pregnant women should receive 1 dose of Tdap vaccine during each pregnancy. The dose should be obtained regardless of the length of time since the last dose. Immunization is preferred during the 27th-36th week of gestation. An adult who has not previously received Tdap or who does not know her vaccine status should receive 1 dose of Tdap. This initial dose should be followed by tetanus and diphtheria toxoids (Td) booster doses every 10 years. Adults with an unknown or incomplete history of completing a 3-dose immunization series with Td-containing vaccines should begin or complete a primary immunization series including a Tdap dose. Adults should receive a Td booster every 10 years.  Varicella vaccine. An adult without evidence of immunity to varicella should receive 2 doses or a second dose if she has previously received 1 dose. Pregnant females who do not have evidence of immunity should receive the first dose after pregnancy. This first dose should be obtained before leaving the health care facility. The second dose should be obtained 4-8 weeks after the first dose.  Human papillomavirus (HPV) vaccine. Females aged 13-26 years who have not received the vaccine previously should obtain the 3-dose series. The vaccine is not recommended for use in pregnant females. However, pregnancy testing is not needed before receiving a dose. If a female is found to be pregnant after receiving a dose, no treatment is needed. In that case, the remaining doses should be delayed until after the pregnancy. Immunization is recommended for any person with an immunocompromised condition through the age of 54  years if she did not get any or all doses earlier. During the 3-dose series, the second dose should be obtained 4-8 weeks after the first dose. The third dose should be obtained 24 weeks after the first dose and 16 weeks after the second dose.  Zoster vaccine. One dose is  recommended for adults aged 44 years or older unless certain conditions are present.  Measles, mumps, and rubella (MMR) vaccine. Adults born before [birth year] generally are considered immune to measles and mumps. Adults born in [birth year] or later should have 1 or more doses of MMR vaccine unless there is a contraindication to the vaccine or there is laboratory evidence of immunity to each of the three diseases. A routine second dose of MMR vaccine should be obtained at least 28 days after the first dose for students attending postsecondary schools, health care workers, or international travelers. People who received inactivated measles vaccine or an unknown type of measles vaccine during 1963-1967 should receive 2 doses of MMR vaccine. People who received inactivated mumps vaccine or an unknown type of mumps vaccine before 1979 and are at high risk for mumps infection should consider immunization with 2 doses of MMR vaccine. For females of childbearing age, rubella immunity should be determined. If there is no evidence of immunity, females who are not pregnant should be vaccinated. If there is no evidence of immunity, females who are pregnant should delay immunization until after pregnancy. Unvaccinated health care workers born before [birth year] who lack laboratory evidence of measles, mumps, or rubella immunity or laboratory confirmation of disease should consider measles and mumps immunization with 2 doses of MMR vaccine or rubella immunization with 1 dose of MMR vaccine.  Pneumococcal 13-valent conjugate (PCV13) vaccine. When indicated, a person who is uncertain of her immunization history and has no record of immunization should receive the PCV13 vaccine.  An adult aged 35 years or older who has certain medical conditions and has not been previously immunized should receive 1 dose of PCV13 vaccine. This PCV13 should be followed with a dose of pneumococcal polysaccharide (PPSV23) vaccine. The PPSV23 vaccine dose should be obtained at least 8 weeks after the dose of PCV13 vaccine. An adult aged 51 years or older who has certain medical conditions and previously received 1 or more doses of PPSV23 vaccine should receive 1 dose of PCV13. The PCV13 vaccine dose should be obtained 1 or more years after the last PPSV23 vaccine dose.  Pneumococcal polysaccharide (PPSV23) vaccine. When PCV13 is also indicated, PCV13 should be obtained first. All adults aged 41 years and older should be immunized. An adult younger than age 66 years who has certain medical conditions should be immunized. Any person who resides in a nursing home or long-term care facility should be immunized. An adult smoker should be immunized. People with an immunocompromised condition and certain other conditions should receive both PCV13 and PPSV23 vaccines. People with human immunodeficiency virus (HIV) infection should be immunized as soon as possible after diagnosis. Immunization during chemotherapy or radiation therapy should be avoided. Routine use of PPSV23 vaccine is not recommended for American Indians, [place] Natives, or people younger than 65 years unless there are medical conditions that require PPSV23 vaccine. When indicated, people who have unknown immunization and have no record of immunization should receive PPSV23 vaccine. One-time revaccination 5 years after the first dose of PPSV23 is recommended for people aged 19-64 years who have chronic kidney failure, nephrotic syndrome, asplenia, or immunocompromised conditions. People who received 1-2 doses of PPSV23 before age 24 years should receive another dose of PPSV23 vaccine at age 100 years or later if at least 5 years have passed since the  previous dose. Doses of PPSV23 are not needed for people immunized with PPSV23 at or after age 67 years.  Meningococcal vaccine. Adults with asplenia or  persistent complement component deficiencies should receive 2 doses of quadrivalent meningococcal conjugate (MenACWY-D) vaccine. The doses should be obtained at least 2 months apart. Microbiologists working with certain meningococcal bacteria, [employer], people at risk during an outbreak, and people who travel to or live in countries with a high rate of meningitis should be immunized. A first-year college student up through age 66 years who is living in a residence hall should receive a dose if she did not receive a dose on or after her 16th birthday. Adults who have certain high-risk conditions should receive one or more doses of vaccine.  Hepatitis A vaccine. Adults who wish to be protected from this disease, have certain high-risk conditions, work with hepatitis A-infected animals, work in hepatitis A research labs, or travel to or work in countries with a high rate of hepatitis A should be immunized. Adults who were previously unvaccinated and who anticipate close contact with an international adoptee during the first 60 days after arrival in the [place] from a country with a high rate of hepatitis A should be immunized.  Hepatitis B vaccine. Adults who wish to be protected from this disease, have certain high-risk conditions, may be exposed to blood or other infectious body fluids, are household contacts or sex partners of hepatitis B positive people, are clients or workers in certain care facilities, or travel to or work in countries with a high rate of hepatitis B should be immunized.  Haemophilus influenzae type b (Hib) vaccine. A previously unvaccinated person with asplenia or sickle cell disease or having a scheduled splenectomy should receive 1 dose of Hib vaccine. Regardless of previous immunization, a recipient of a hematopoietic  stem cell transplant should receive a 3-dose series 6-12 months after her successful transplant. Hib vaccine is not recommended for adults with HIV infection. Preventive Services / Frequency Ages 36 to 71 years  Blood pressure check.** / Every 1 to 2 years.  Lipid and cholesterol check.** / Every 5 years beginning at age 20.  Clinical breast exam.** / Every 3 years for women in their 74s and 1s.  BRCA-related cancer risk assessment.** / For women who have family members with a BRCA-related cancer (breast, ovarian, tubal, or peritoneal cancers).  Pap test.** / Every 2 years from ages 58 through 59. Every 3 years starting at age 56 through age 68 or 65 with a history of 3 consecutive normal Pap tests.  HPV screening.** / Every 3 years from ages 75 through ages 15 to 36 with a history of 3 consecutive normal Pap tests.  Hepatitis C blood test.** / For any individual with known risks for hepatitis C.  Skin self-exam. / Monthly.  Influenza vaccine. / Every year.  Tetanus, diphtheria, and acellular pertussis (Tdap, Td) vaccine.** / Consult your health care provider. Pregnant women should receive 1 dose of Tdap vaccine during each pregnancy. 1 dose of Td every 10 years.  Varicella vaccine.** / Consult your health care provider. Pregnant females who do not have evidence of immunity should receive the first dose after pregnancy.  HPV vaccine. / 3 doses over 6 months, if 84 and younger. The vaccine is not recommended for use in pregnant females. However, pregnancy testing is not needed before receiving a dose.  Measles, mumps, rubella (MMR) vaccine.** / You need at least 1 dose of MMR if you were born in 1957 or later. You may also need a 2nd dose. For females of childbearing age, rubella immunity should be determined. If there is no  evidence of immunity, females who are not pregnant should be vaccinated. If there is no evidence of immunity, females who are pregnant should delay immunization until  after pregnancy.  Pneumococcal 13-valent conjugate (PCV13) vaccine.** / Consult your health care provider.  Pneumococcal polysaccharide (PPSV23) vaccine.** / 1 to 2 doses if you smoke cigarettes or if you have certain conditions.  Meningococcal vaccine.** / 1 dose if you are age 47 to 73 years and a Market researcher living in a residence hall, or have one of several medical conditions, you need to get vaccinated against meningococcal disease. You may also need additional booster doses.  Hepatitis A vaccine.** / Consult your health care provider.  Hepatitis B vaccine.** / Consult your health care provider.  Haemophilus influenzae type b (Hib) vaccine.** / Consult your health care provider. Ages 102 to 76 years  Blood pressure check.** / Every 1 to 2 years.  Lipid and cholesterol check.** / Every 5 years beginning at age 42 years.  Lung cancer screening. / Every year if you are aged 46-80 years and have a 30-pack-year history of smoking and currently smoke or have quit within the past 15 years. Yearly screening is stopped once you have quit smoking for at least 15 years or develop a health problem that would prevent you from having lung cancer treatment.  Clinical breast exam.** / Every year after age 55 years.  BRCA-related cancer risk assessment.** / For women who have family members with a BRCA-related cancer (breast, ovarian, tubal, or peritoneal cancers).  Mammogram.** / Every year beginning at age 32 years and continuing for as long as you are in good health. Consult with your health care provider.  Pap test.** / Every 3 years starting at age 29 years through age 58 or 55 years with a history of 3 consecutive normal Pap tests.  HPV screening.** / Every 3 years from ages 23 years through ages 64 to 45 years with a history of 3 consecutive normal Pap tests.  Fecal occult blood test (FOBT) of stool. / Every year beginning at age 65 years and continuing until age 49 years.  You may not need to do this test if you get a colonoscopy every 10 years.  Flexible sigmoidoscopy or colonoscopy.** / Every 5 years for a flexible sigmoidoscopy or every 10 years for a colonoscopy beginning at age 44 years and continuing until age 36 years.  Hepatitis C blood test.** / For all people born from [birth year] through 1964 and any individual with known risks for hepatitis C.  Skin self-exam. / Monthly.  Influenza vaccine. / Every year.  Tetanus, diphtheria, and acellular pertussis (Tdap/Td) vaccine.** / Consult your health care provider. Pregnant women should receive 1 dose of Tdap vaccine during each pregnancy. 1 dose of Td every 10 years.  Varicella vaccine.** / Consult your health care provider. Pregnant females who do not have evidence of immunity should receive the first dose after pregnancy.  Zoster vaccine.** / 1 dose for adults aged 69 years or older.  Measles, mumps, rubella (MMR) vaccine.** / You need at least 1 dose of MMR if you were born in 1957 or later. You may also need a 2nd dose. For females of childbearing age, rubella immunity should be determined. If there is no evidence of immunity, females who are not pregnant should be vaccinated. If there is no evidence of immunity, females who are pregnant should delay immunization until after pregnancy.  Pneumococcal 13-valent conjugate (PCV13) vaccine.** / Consult your health care provider.  Pneumococcal polysaccharide (PPSV23) vaccine.** / 1 to 2 doses if you smoke cigarettes or if you have certain conditions.  Meningococcal vaccine.** / Consult your health care provider.  Hepatitis A vaccine.** / Consult your health care provider.  Hepatitis B vaccine.** / Consult your health care provider.  Haemophilus influenzae type b (Hib) vaccine.** / Consult your health care provider. Ages 45 years and over  Blood pressure check.** / Every 1 to 2 years.  Lipid and cholesterol check.** / Every 5 years beginning at age 58  years.  Lung cancer screening. / Every year if you are aged 80-80 years and have a 30-pack-year history of smoking and currently smoke or have quit within the past 15 years. Yearly screening is stopped once you have quit smoking for at least 15 years or develop a health problem that would prevent you from having lung cancer treatment.  Clinical breast exam.** / Every year after age 67 years.  BRCA-related cancer risk assessment.** / For women who have family members with a BRCA-related cancer (breast, ovarian, tubal, or peritoneal cancers).  Mammogram.** / Every year beginning at age 34 years and continuing for as long as you are in good health. Consult with your health care provider.  Pap test.** / Every 3 years starting at age 27 years through age 44 or 23 years with 3 consecutive normal Pap tests. Testing can be stopped between 65 and 70 years with 3 consecutive normal Pap tests and no abnormal Pap or HPV tests in the past 10 years.  HPV screening.** / Every 3 years from ages 22 years through ages 31 or 27 years with a history of 3 consecutive normal Pap tests. Testing can be stopped between 65 and 70 years with 3 consecutive normal Pap tests and no abnormal Pap or HPV tests in the past 10 years.  Fecal occult blood test (FOBT) of stool. / Every year beginning at age 17 years and continuing until age 43 years. You may not need to do this test if you get a colonoscopy every 10 years.  Flexible sigmoidoscopy or colonoscopy.** / Every 5 years for a flexible sigmoidoscopy or every 10 years for a colonoscopy beginning at age 45 years and continuing until age 88 years.  Hepatitis C blood test.** / For all people born from [birth year] through 1964 and any individual with known risks for hepatitis C.  Osteoporosis screening.** / A one-time screening for women ages 67 years and over and women at risk for fractures or osteoporosis.  Skin self-exam. / Monthly.  Influenza vaccine. / Every year.  Tetanus,  diphtheria, and acellular pertussis (Tdap/Td) vaccine.** / 1 dose of Td every 10 years.  Varicella vaccine.** / Consult your health care provider.  Zoster vaccine.** / 1 dose for adults aged 20 years or older.  Pneumococcal 13-valent conjugate (PCV13) vaccine.** / Consult your health care provider.  Pneumococcal polysaccharide (PPSV23) vaccine.** / 1 dose for all adults aged 60 years and older.  Meningococcal vaccine.** / Consult your health care provider.  Hepatitis A vaccine.** / Consult your health care provider.  Hepatitis B vaccine.** / Consult your health care provider.  Haemophilus influenzae type b (Hib) vaccine.** / Consult your health care provider. ** Family history and personal history of risk and conditions may change your health care provider's recommendations. Document Released: 02/11/2002 Document Revised: 05/02/2014 Document Reviewed: 05/13/2011 [facility] Patient Information 2015 [place], [place]. This information is not intended to replace advice given to you by your health care provider. Make sure you discuss  any questions you have with your health care provider.

## 2015-04-04 NOTE — Progress Notes (Signed)
Pre visit review using our clinic review tool, if applicable. No additional management support is needed unless otherwise documented below in the visit note/SLS  

## 2015-04-04 NOTE — Progress Notes (Signed)
Subjective:    Kelsey Beasley is a 69 y.o. female who presents for Medicare Annual/Subsequent preventive examination.  Preventive Screening-Counseling & Management  Tobacco History  Smoking status  . Never Smoker   Smokeless tobacco  . Never Used     Problems Prior to Visit 1. Depression and Anxiety -- improved slightly with Lexapro 10 mg daily. Endorses still having issues with depressed mood and anxiety. Denies suicidal thought or ideation.  2. GERD -- previously been on omeprazole 20 mg twice daily with good relief of symptoms. Now having more frequent heartburn at nighttime. Endorses good diet.  Current Problems (verified) Patient Active Problem List   Diagnosis Date Noted  . Osteopenia 08/12/2014  . GERD (gastroesophageal reflux disease) 11/07/2013  . Other and unspecified hyperlipidemia 11/07/2013  . Essential hypertension, benign 11/07/2013  . Unspecified vitamin D deficiency 11/07/2013  . Anxiety state, unspecified 11/07/2013  . Back pain 11/07/2013  . Need for prophylactic vaccination and inoculation against influenza 11/07/2013    Medications Prior to Visit No current outpatient prescriptions on file prior to visit.   No current facility-administered medications on file prior to visit.    Current Medications (verified) Current Outpatient Prescriptions  Medication Sig Dispense Refill  . escitalopram (LEXAPRO) 10 MG tablet Take 1 tablet (10 mg total) by mouth daily. 90 tablet 1  . ezetimibe (ZETIA) 10 MG tablet Take 1 tablet (10 mg total) by mouth daily. 90 tablet 1  . lisinopril-hydrochlorothiazide (PRINZIDE,ZESTORETIC) 20-12.5 MG per tablet Take 1 tablet by mouth daily. 90 tablet 1  . nitroGLYCERIN (NITRODUR - DOSED IN MG/24 HR) 0.2 mg/hr patch Apply 1/4th of a patch to left shoulder for rotator cuff tendinopathy  - change daily 30 patch 1  . omeprazole (PRILOSEC) 20 MG capsule Take 1 capsule (20 mg total) by mouth 2 (two) times daily. 180 capsule 1  .  pravastatin (PRAVACHOL) 40 MG tablet Take 1 tablet (40 mg total) by mouth daily. 90 tablet 1   No current facility-administered medications for this visit.     Allergies (verified) Review of patient's allergies indicates no known allergies.   PAST HISTORY  Family History Family History  Problem Relation Age of Onset  . Hyperlipidemia Mother     Living  . Hypertension Mother   . Heart attack Neg Hx   . Sudden death Neg Hx   . COPD Father   . Lung cancer Father 78    Deceased  . Hyperlipidemia Sister   . Hypertension Sister   . Throat cancer Brother   . Cancer Brother     throat cancer died age 51  . Diabetes Maternal Grandmother   . Heart disease Paternal Grandmother   . Heart disease Paternal Grandfather   . COPD Sister   . Alcohol abuse Sister     x2  . Liver disease Sister   . Bipolar disorder Daughter   . Healthy Son     x2    Social History History  Substance Use Topics  . Smoking status: Never Smoker   . Smokeless tobacco: Never Used  . Alcohol Use: No     Are there smokers in your home (other than you)? No  Risk Factors Current exercise habits: Is walking at home several times per week Dietary issues discussed: well-balanced diet.  Drinks good amount of water. Body mass index is 26.89 kg/(m^2).  Cardiac risk factors: advanced age (older than 18 for men, 24 for women), dyslipidemia and hypertension.  Depression Screen (Note: if answer to  either of the following is "Yes", a more complete depression screening is indicated)   Over the past two weeks, have you felt down, depressed or hopeless? Yes  Over the past two weeks, have you felt little interest or pleasure in doing things? Yes  Have you lost interest or pleasure in daily life? No  Do you often feel hopeless? Yes  Do you cry easily over simple problems? Improving with Lexapro  Activities of Daily Living In your present state of health, do you have any difficulty performing the following  activities?:  Driving? No - husband usually drives Managing money?  No Feeding yourself? No Getting from bed to chair? No Climbing a flight of stairs? No Preparing food and eating?: No Bathing or showering? No Getting dressed: No Getting to the toilet? No Using the toilet:No Moving around from place to place: No In the past year have you fallen or had a near fall?:No   Are you sexually active?  No  Do you have more than one partner?  NA  Hearing Difficulties: No Do you often ask people to speak up or repeat themselves? No Do you experience ringing or noises in your ears? No Do you have difficulty understanding soft or whispered voices? No   Do you feel that you have a problem with memory? No  Do you often misplace items? No  Do you feel safe at home?  Yes  Cognitive Testing  Alert? Yes  Normal Appearance?Yes  Oriented to person? Yes  Place? Yes   Time? Yes  Recall of three objects?  No  Can perform simple calculations? No  Displays appropriate judgment?Yes   Advanced Directives have been discussed with the patient? Yes  List the Names of Other Physician/Practitioners you currently use: 1.  None  Indicate any recent Medical Services you may have received from other than [facility] providers in the past year (date may be approximate).  Immunization History  Administered Date(s) Administered  . Influenza,inj,Quad PF,36+ Mos 09/08/2013, 09/12/2014  . Pneumococcal-Unspecified 02/03/2013  . Tdap 04/29/2007  . Zoster 01/02/2009    Screening Tests Health Maintenance  Topic Date Due  . Hepatitis C Screening  [birth year]  . DEXA SCAN  03/06/2012  . PNA vac Low Risk Adult (2 of 2 - PCV13) 02/03/2014  . INFLUENZA VACCINE  07/31/2015  . MAMMOGRAM  10/26/2015  . TETANUS/TDAP  04/28/2017  . COLONOSCOPY  04/30/2019  . ZOSTAVAX  Completed    All answers were reviewed with the patient and necessary referrals were made:  Leeanne Rio, PA-C   04/04/2015   History  reviewed: allergies, current medications, past family history, past medical history, past social history, past surgical history and problem list  Review of Systems A comprehensive review of systems was negative.    Objective:     Vision by Snellen chart: right NID:POEUMP to measure, left NTI:RWERXV to measure  Body mass index is 26.89 kg/(m^2). Pulse 70  Temp(Src) 98.4 F (36.9 C) (Oral)  Resp 16  Ht 5\' 1"  (1.549 m)  Wt 142 lb 4 oz (64.524 kg)  BMI 26.89 kg/m2  SpO2 97%  Pulse 70  Temp(Src) 98.4 F (36.9 C) (Oral)  Resp 16  Ht 5\' 1"  (1.549 m)  Wt 142 lb 4 oz (64.524 kg)  BMI 26.89 kg/m2  SpO2 97% General appearance: alert, cooperative, appears stated age and no distress Head: Normocephalic, without obvious abnormality, atraumatic Eyes: conjunctivae/corneas clear. PERRL, EOM's intact. Fundi benign. Ears: normal TM's and external ear canals both  ears Nose: Nares normal. Septum midline. Mucosa normal. No drainage or sinus tenderness. Throat: lips, mucosa, and tongue normal; teeth and gums normal Lungs: clear to auscultation bilaterally Heart: regular rate and rhythm, S1, S2 normal, no murmur, click, rub or gallop Abdomen: soft, non-tender; bowel sounds normal; no masses,  no organomegaly Extremities: extremities normal, atraumatic, no cyanosis or edema Pulses: 2+ and symmetric Neurologic: Alert and oriented X 3, normal strength and tone. Normal symmetric reflexes. Normal coordination and gait     Assessment:     (1) Medicare Annual Wellness, Subsequent (2) Screening for Ischemic Heart Disease (3) GERD (4) Anxiety and Depression (5) Osteoporosis Screening (6) Need for vaccination against penumonia     Plan:     During the course of the visit the patient was educated and counseled about appropriate screening and preventive services including:    Pneumococcal vaccine   Bone densitometry screening  Diabetes screening  Nutrition counseling   Advanced  directives: has NO advanced directive  - add't info requested. Referral to SW: not applicable  Diet review for nutrition referral? Yes ____  Not Indicated __x__  [place] Border given by nursing staff. Will order DEXA to screen for osteoporosis.  EKG obtained with NSR.  Will start Prilosec 20 mg Qam and Zantac 150 mg each PM. Will increase Lexapro to 20 mg daily for anxiety and depression  Will obtain fasting labs today.  Patient Instructions (the written plan) was given to the patient.  Medicare Attestation I have personally reviewed: The patient's medical and social history Their use of alcohol, tobacco or illicit drugs Their current medications and supplements The patient's functional ability including ADLs,fall risks, home safety risks, cognitive, and hearing and visual impairment Diet and physical activities Evidence for depression or mood disorders  The patient's weight, height, BMI, and visual acuity have been recorded in the chart.  I have made referrals, counseling, and provided education to the patient based on review of the above and I have provided the patient with a written personalized care plan for preventive services.     Raiford Noble [place], [place]   04/04/2015

## 2015-04-05 NOTE — Addendum Note (Signed)
Addended by: Peggyann Shoals on: 04/05/2015 08:49 AM   Modules accepted: Orders

## 2015-04-28 DIAGNOSIS — L239 Allergic contact dermatitis, unspecified cause: Secondary | ICD-10-CM | POA: Diagnosis not present

## 2015-05-08 ENCOUNTER — Ambulatory Visit (INDEPENDENT_AMBULATORY_CARE_PROVIDER_SITE_OTHER): Payer: Medicare Other | Admitting: Physician Assistant

## 2015-05-08 ENCOUNTER — Encounter: Payer: Self-pay | Admitting: Physician Assistant

## 2015-05-08 VITALS — BP 110/70 | HR 67 | Temp 98.0°F | Wt 136.0 lb

## 2015-05-08 DIAGNOSIS — F32A Depression, unspecified: Secondary | ICD-10-CM

## 2015-05-08 DIAGNOSIS — F418 Other specified anxiety disorders: Secondary | ICD-10-CM

## 2015-05-08 DIAGNOSIS — F419 Anxiety disorder, unspecified: Principal | ICD-10-CM

## 2015-05-08 DIAGNOSIS — F329 Major depressive disorder, single episode, unspecified: Secondary | ICD-10-CM

## 2015-05-08 NOTE — Progress Notes (Signed)
Patient presents to clinic today for follow-up of anxiety/depression after Lexapro is increased to 20 mg daily.  Endorses great improvement in mood and anxiety. Endorses restful sleep, around 7-8 hours.  Is eating better and not "eating her feelings".  Denies SI/HI.  Past Medical History  Diagnosis Date  . Hypertension   . Hyperlipidemia   . GERD (gastroesophageal reflux disease)   . Anxiety   . Anti-D antibodies present   . Blood transfusion without reported diagnosis   . Achalasia   . Osteopenia   . Depression   . Dysplastic colon polyp   . Chicken pox   . Shingles     Current Outpatient Prescriptions on File Prior to Visit  Medication Sig Dispense Refill  . escitalopram (LEXAPRO) 20 MG tablet Take 1 tablet (20 mg total) by mouth daily. 90 tablet 1  . ezetimibe (ZETIA) 10 MG tablet Take 1 tablet (10 mg total) by mouth daily. 90 tablet 1  . fluticasone (FLONASE) 50 MCG/ACT nasal spray Place 2 sprays into both nostrils daily. 16 g 6  . lisinopril-hydrochlorothiazide (PRINZIDE,ZESTORETIC) 20-12.5 MG per tablet Take 1 tablet by mouth daily. 90 tablet 1  . nitroGLYCERIN (NITRODUR - DOSED IN MG/24 HR) 0.2 mg/hr patch Apply 1/4th of a patch to left shoulder for rotator cuff tendinopathy  - change daily 30 patch 1  . omeprazole (PRILOSEC) 20 MG capsule Take 1 capsule (20 mg total) by mouth 2 (two) times daily. 180 capsule 1  . pravastatin (PRAVACHOL) 40 MG tablet Take 1 tablet (40 mg total) by mouth daily. 90 tablet 1  . ranitidine (ZANTAC) 150 MG capsule Take 1 capsule (150 mg total) by mouth every evening. 90 capsule 0   No current facility-administered medications on file prior to visit.    No Known Allergies  Family History  Problem Relation Age of Onset  . Hyperlipidemia Mother     Living  . Hypertension Mother   . Heart attack Neg Hx   . Sudden death Neg Hx   . COPD Father   . Lung cancer Father 90    Deceased  . Hyperlipidemia Sister   . Hypertension Sister   .  Throat cancer Brother   . Cancer Brother     throat cancer died age 35  . Diabetes Maternal Grandmother   . Heart disease Paternal Grandmother   . Heart disease Paternal Grandfather   . COPD Sister   . Alcohol abuse Sister     x2  . Liver disease Sister   . Bipolar disorder Daughter   . Healthy Son     x2    History   Social History  . Marital Status: Married    Spouse Name: N/A  . Number of Children: N/A  . Years of Education: N/A   Social History Main Topics  . Smoking status: Never Smoker   . Smokeless tobacco: Never Used  . Alcohol Use: No  . Drug Use: No  . Sexual Activity: Not on file   Other Topics Concern  . None   Social History Narrative   Marital Status:  Married Jeneen Rinks)    Children:  G3 [id number]   Pets:  None    Living Situation: Lives with spouse, daughter and grandson.     Occupation: Therapist, occupational   Drug Use:  None   Diet:  Regular   Exercise:  Yardwork; Walking    Hobbies:  Reading , [place], Grandchildren    [Tobacco:  Never smoker]   Review of Systems - See HPI.  All other ROS are negative.  BP 110/70 mmHg  Pulse 67  Temp(Src) 98 F (36.7 C)  Wt 136 lb (61.689 kg)  SpO2 100%  Physical Exam  Constitutional: She is oriented to person, place, and time and well-developed, well-nourished, and in no distress.  HENT:  Head: Normocephalic and atraumatic.  Eyes: Conjunctivae are normal.  Cardiovascular: Normal rate, regular rhythm, normal heart sounds and intact distal pulses.   Pulmonary/Chest: Effort normal and breath sounds normal.  Neurological: She is alert and oriented to person, place, and time.  Skin: Skin is warm and dry. No rash noted.  Psychiatric: Affect normal.  Vitals reviewed.   Recent Results (from the past 2160 hour(s))  CBC     Status: None   Collection Time: 04/04/15 10:17 AM  Result Value Ref Range   WBC 5.0 4.0 - 10.5 K/uL   RBC 4.59 3.87 - 5.11 Mil/uL   Platelets 285.0 150.0 - 400.0  K/uL   Hemoglobin 14.1 12.0 - 15.0 g/dL   HCT 42.1 36.0 - 46.0 %   MCV 91.8 78.0 - 100.0 fl   MCHC 33.6 30.0 - 36.0 g/dL   RDW 14.4 11.5 - 70.9 %  Basic Metabolic Panel (BMET)     Status: Abnormal   Collection Time: 04/04/15 10:17 AM  Result Value Ref Range   Sodium 138 135 - 145 mEq/L   Potassium 4.2 3.5 - 5.1 mEq/L   Chloride 101 96 - 112 mEq/L   CO2 32 19 - 32 mEq/L   Glucose, Bld 104 (H) 70 - 99 mg/dL   BUN 16 6 - 23 mg/dL   Creatinine, Ser 0.85 0.40 - 1.20 mg/dL   Calcium 9.9 8.4 - 10.5 mg/dL   GFR 70.68 >60.00 mL/min  Hepatic function panel     Status: None   Collection Time: 04/04/15 10:17 AM  Result Value Ref Range   Total Bilirubin 0.6 0.2 - 1.2 mg/dL   Bilirubin, Direct 0.1 0.0 - 0.3 mg/dL   Alkaline Phosphatase 77 39 - 117 U/L   AST 22 0 - 37 U/L   ALT 16 0 - 35 U/L   Total Protein 7.2 6.0 - 8.3 g/dL   Albumin 4.2 3.5 - 5.2 g/dL  Urinalysis, Routine w reflex microscopic     Status: Abnormal   Collection Time: 04/04/15 10:17 AM  Result Value Ref Range   Color, Urine YELLOW Yellow;Lt. Yellow   APPearance CLEAR Clear   Specific Gravity, Urine 1.015 1.000-1.030   pH 8.5 (A) 5.0 - 8.0   Total Protein, Urine TRACE (A) Negative   Urine Glucose NEGATIVE Negative   Ketones, ur NEGATIVE Negative   Bilirubin Urine NEGATIVE Negative   Hgb urine dipstick SMALL (A) Negative   Urobilinogen, UA 0.2 0.0 - 1.0   Leukocytes, UA SMALL (A) Negative   Nitrite NEGATIVE Negative   WBC, UA 0-2/hpf 0-2/hpf   RBC / HPF 0-2/hpf 0-2/hpf   Squamous Epithelial / LPF Rare(0-4/hpf) Rare(0-4/hpf)  Lipid Profile     Status: None   Collection Time: 04/04/15 10:17 AM  Result Value Ref Range   Cholesterol 156 0 - 200 mg/dL    Comment: ATP III Classification       Desirable:  < 200 mg/dL               Borderline High:  200 - 239 mg/dL          High:  > =  240 mg/dL   Triglycerides 92.0 0.0 - 149.0 mg/dL    Comment: Normal:  <150 mg/dLBorderline High:  150 - 199 mg/dL   HDL 50.60 >39.00 mg/dL     VLDL 18.4 0.0 - 40.0 mg/dL   LDL Cholesterol 87 0 - 99 mg/dL   Total CHOL/HDL Ratio 3     Comment:                Men          Women1/2 Average Risk     3.4          3.3Average Risk          5.0          4.42X Average Risk          9.6          7.13X Average Risk          15.0          11.0                       NonHDL 105.40     Comment: NOTE:  Non-HDL goal should be 30 mg/dL higher than patient's LDL goal (i.e. LDL goal of < 70 mg/dL, would have non-HDL goal of < 100 mg/dL)    Assessment/Plan: Anxiety and depression Doing very well.  Will continue current regimen.  Follow-up in 6 months.

## 2015-05-08 NOTE — Patient Instructions (Signed)
Please continue the Lexapro daily as directed. I am glad you are feeling much better! If everything continues we will follow-up in 6 months. Return sooner if needed.

## 2015-05-08 NOTE — Assessment & Plan Note (Signed)
Doing very well.  Will continue current regimen.  Follow-up in 6 months.

## 2015-05-08 NOTE — Progress Notes (Signed)
Pre visit review using our clinic review tool, if applicable. No additional management support is needed unless otherwise documented below in the visit note. 

## 2015-05-16 DIAGNOSIS — Z780 Asymptomatic menopausal state: Secondary | ICD-10-CM | POA: Diagnosis not present

## 2015-05-16 LAB — HM DEXA SCAN

## 2015-06-13 ENCOUNTER — Other Ambulatory Visit: Payer: Self-pay | Admitting: Physician Assistant

## 2015-09-18 ENCOUNTER — Encounter: Payer: Self-pay | Admitting: Physician Assistant

## 2015-09-18 ENCOUNTER — Ambulatory Visit (INDEPENDENT_AMBULATORY_CARE_PROVIDER_SITE_OTHER): Payer: Medicare Other | Admitting: Physician Assistant

## 2015-09-18 VITALS — BP 104/70 | HR 73 | Temp 98.5°F | Resp 16 | Ht 67.0 in | Wt 143.4 lb

## 2015-09-18 DIAGNOSIS — M25562 Pain in left knee: Secondary | ICD-10-CM | POA: Diagnosis not present

## 2015-09-18 DIAGNOSIS — F411 Generalized anxiety disorder: Secondary | ICD-10-CM

## 2015-09-18 MED ORDER — ACETAMINOPHEN-CODEINE #3 300-30 MG PO TABS: 1.0000 | ORAL_TABLET | Freq: Three times a day (TID) | ORAL | Status: DC | PRN

## 2015-09-18 MED ORDER — ESCITALOPRAM OXALATE 20 MG PO TABS: 20.0000 mg | ORAL_TABLET | Freq: Every day | ORAL | Status: DC

## 2015-09-18 NOTE — Progress Notes (Signed)
Patient presents to clinic today for follow-up of anxiety/depression. Symptoms previously well controlled Lexapro 20 mg without breakthrough symptoms. Has noted some worsening of mood over past couple of days since running out of medication. Denies SI/HI.   Patient c/o 1 week of left knee pain that is described as achy and located on the medial and lateral aspect of left knee. Notes some weakness in knee but denies buckling. Denies trauma or injury. Denies swelling. Has taken tylenol with some relief in symptoms.  Past Medical History  Diagnosis Date  . Hypertension   . Hyperlipidemia   . GERD (gastroesophageal reflux disease)   . Anxiety   . Anti-D antibodies present   . Blood transfusion without reported diagnosis   . Achalasia   . Osteopenia   . Depression   . Dysplastic colon polyp   . Chicken pox   . Shingles     Current Outpatient Prescriptions on File Prior to Visit  Medication Sig Dispense Refill  . ezetimibe (ZETIA) 10 MG tablet Take 1 tablet (10 mg total) by mouth daily. 90 tablet 1  . fluticasone (FLONASE) 50 MCG/ACT nasal spray Place 2 sprays into both nostrils daily. 16 g 6  . lisinopril-hydrochlorothiazide (PRINZIDE,ZESTORETIC) 20-12.5 MG per tablet Take 1 tablet by mouth daily. 90 tablet 1  . nitroGLYCERIN (NITRODUR - DOSED IN MG/24 HR) 0.2 mg/hr patch Apply 1/4th of a patch to left shoulder for rotator cuff tendinopathy  - change daily 30 patch 1  . omeprazole (PRILOSEC) 20 MG capsule Take 1 capsule (20 mg total) by mouth 2 (two) times daily. 180 capsule 1  . pravastatin (PRAVACHOL) 40 MG tablet Take 1 tablet (40 mg total) by mouth daily. 90 tablet 1  . ranitidine (ZANTAC) 150 MG capsule TAKE 1 CAPSULE EVERY EVENING 90 capsule 1   No current facility-administered medications on file prior to visit.    No Known Allergies  Family History  Problem Relation Age of Onset  . Hyperlipidemia Mother     Living  . Hypertension Mother   . Heart attack Neg Hx   .  Sudden death Neg Hx   . COPD Father   . Lung cancer Father 50    Deceased  . Hyperlipidemia Sister   . Hypertension Sister   . Throat cancer Brother   . Cancer Brother     throat cancer died age 78  . Diabetes Maternal Grandmother   . Heart disease Paternal Grandmother   . Heart disease Paternal Grandfather   . COPD Sister   . Alcohol abuse Sister     x2  . Liver disease Sister   . Bipolar disorder Daughter   . Healthy Son     x2    Social History   Social History  . Marital Status: Married    Spouse Name: N/A  . Number of Children: N/A  . Years of Education: N/A   Social History Main Topics  . Smoking status: Never Smoker   . Smokeless tobacco: Never Used  . Alcohol Use: No  . Drug Use: No  . Sexual Activity: Not Asked   Other Topics Concern  . None   Social History Narrative   Marital Status:  Married Jeneen Rinks)    Children:  G3 [id number]   Pets:  None    Living Situation: Lives with spouse, daughter and grandson.     Occupation: Therapist, occupational   Drug Use:  None   Diet:  Regular  Exercise:  Zettie Pho; Walking    Hobbies:  Reading , Bowling, Grandchildren    [Tobacco: Never smoker]   Review of Systems - See HPI.  All other ROS are negative.  BP 104/70 mmHg  Pulse 73  Temp(Src) 98.5 F (36.9 C) (Oral)  Resp 16  Ht 5\' 7"  (1.702 m)  Wt 143 lb 6 oz (65.034 kg)  BMI 22.45 kg/m2  SpO2 98%  Physical Exam  Constitutional: She is oriented to person, place, and time and well-developed, well-nourished, and in no distress.  HENT:  Head: Normocephalic and atraumatic.  Eyes: Conjunctivae are normal.  Neck: Neck supple.  Cardiovascular: Normal rate, regular rhythm, normal heart sounds and intact distal pulses.   Pulmonary/Chest: Effort normal and breath sounds normal. No respiratory distress. She has no wheezes. She has no rales. She exhibits no tenderness.  Neurological: She is alert and oriented to person, place, and time.  Skin:  Skin is warm and dry. No rash noted.  Psychiatric: Affect normal.  Vitals reviewed.  No results found for this or any previous visit (from the past 2160 hour(s)).  Assessment/Plan: Generalized anxiety disorder Resume Lexapro 20 mg daily. Medication sent to pharmacy.  Left knee pain Suspect muscle sprain. Knee brace applied. Rx tylenol #. RICE discussed. Follow-up if not resolving in 1 week

## 2015-09-18 NOTE — Patient Instructions (Signed)
Please take pain medication as directed. Ice the knee and elevate it while resting. Wear the knee sleeve daily as directed. Continue wearing supportive lace-up shoes.   Follow-up if symptoms not resolving.  Please remember to restart your Lexapro daily as directed.

## 2015-09-18 NOTE — Assessment & Plan Note (Signed)
Suspect muscle sprain. Knee brace applied. Rx tylenol #. RICE discussed. Follow-up if not resolving in 1 week

## 2015-09-18 NOTE — Assessment & Plan Note (Signed)
Resume Lexapro 20 mg daily. Medication sent to pharmacy.

## 2015-11-08 ENCOUNTER — Ambulatory Visit (INDEPENDENT_AMBULATORY_CARE_PROVIDER_SITE_OTHER): Payer: Medicare Other | Admitting: Physician Assistant

## 2015-11-12 NOTE — Progress Notes (Signed)
error 

## 2015-12-08 ENCOUNTER — Other Ambulatory Visit: Payer: Self-pay | Admitting: Physician Assistant

## 2015-12-12 ENCOUNTER — Ambulatory Visit: Payer: Medicare Other

## 2015-12-21 ENCOUNTER — Ambulatory Visit: Payer: Self-pay

## 2015-12-21 ENCOUNTER — Ambulatory Visit (INDEPENDENT_AMBULATORY_CARE_PROVIDER_SITE_OTHER): Payer: Medicare Other | Admitting: Behavioral Health

## 2015-12-21 DIAGNOSIS — Z23 Encounter for immunization: Secondary | ICD-10-CM | POA: Diagnosis not present

## 2015-12-21 NOTE — Progress Notes (Signed)
Pre visit review using our clinic review tool, if applicable. No additional management support is needed unless otherwise documented below in the visit note. 

## 2015-12-22 DIAGNOSIS — Z139 Encounter for screening, unspecified: Secondary | ICD-10-CM | POA: Diagnosis not present

## 2015-12-22 DIAGNOSIS — Z1231 Encounter for screening mammogram for malignant neoplasm of breast: Secondary | ICD-10-CM | POA: Diagnosis not present

## 2015-12-22 LAB — HM MAMMOGRAPHY: HM Mammogram: NORMAL

## 2015-12-27 ENCOUNTER — Encounter: Payer: Self-pay | Admitting: *Deleted

## 2016-01-05 DIAGNOSIS — H524 Presbyopia: Secondary | ICD-10-CM | POA: Diagnosis not present

## 2016-01-05 DIAGNOSIS — H2513 Age-related nuclear cataract, bilateral: Secondary | ICD-10-CM | POA: Diagnosis not present

## 2016-04-03 ENCOUNTER — Ambulatory Visit (INDEPENDENT_AMBULATORY_CARE_PROVIDER_SITE_OTHER): Payer: Medicare Other | Admitting: Physician Assistant

## 2016-04-03 ENCOUNTER — Encounter: Payer: Self-pay | Admitting: Physician Assistant

## 2016-04-03 VITALS — BP 112/60 | HR 82 | Temp 98.2°F | Ht 67.0 in | Wt 141.8 lb

## 2016-04-03 DIAGNOSIS — I10 Essential (primary) hypertension: Secondary | ICD-10-CM

## 2016-04-03 DIAGNOSIS — B9689 Other specified bacterial agents as the cause of diseases classified elsewhere: Secondary | ICD-10-CM

## 2016-04-03 DIAGNOSIS — F411 Generalized anxiety disorder: Secondary | ICD-10-CM

## 2016-04-03 DIAGNOSIS — E785 Hyperlipidemia, unspecified: Secondary | ICD-10-CM

## 2016-04-03 DIAGNOSIS — J019 Acute sinusitis, unspecified: Secondary | ICD-10-CM | POA: Diagnosis not present

## 2016-04-03 MED ORDER — ESCITALOPRAM OXALATE 20 MG PO TABS: 20.0000 mg | ORAL_TABLET | Freq: Every day | ORAL | Status: DC

## 2016-04-03 MED ORDER — EZETIMIBE 10 MG PO TABS: 10.0000 mg | ORAL_TABLET | Freq: Every day | ORAL | Status: DC

## 2016-04-03 MED ORDER — PRAVASTATIN SODIUM 40 MG PO TABS: 40.0000 mg | ORAL_TABLET | Freq: Every day | ORAL | Status: DC

## 2016-04-03 MED ORDER — LISINOPRIL-HYDROCHLOROTHIAZIDE 20-12.5 MG PO TABS: 1.0000 | ORAL_TABLET | Freq: Every day | ORAL | Status: DC

## 2016-04-03 MED ORDER — AMOXICILLIN-POT CLAVULANATE 875-125 MG PO TABS: 1.0000 | ORAL_TABLET | Freq: Two times a day (BID) | ORAL | Status: DC

## 2016-04-03 NOTE — Patient Instructions (Signed)
Please resume chronic medications as directed. I have sent in one fill of medications to [facility] so that we can assess how the medications are working at your physical.   Please schedule the physical within the next 1-2 months.  Please take antibiotic as directed.  Increase fluid intake.  Use Saline nasal spray.  Take a daily multivitamin. Continue Flonase and start a daily Claritin.  Place a humidifier in the bedroom.  Please call or return clinic if symptoms are not improving.  Sinusitis Sinusitis is redness, soreness, and swelling (inflammation) of the paranasal sinuses. Paranasal sinuses are air pockets within the bones of your face (beneath the eyes, the middle of the forehead, or above the eyes). In healthy paranasal sinuses, mucus is able to drain out, and air is able to circulate through them by way of your nose. However, when your paranasal sinuses are inflamed, mucus and air can become trapped. This can allow bacteria and other germs to grow and cause infection. Sinusitis can develop quickly and last only a short time (acute) or continue over a long period (chronic). Sinusitis that lasts for more than 12 weeks is considered chronic.  CAUSES  Causes of sinusitis include:  Allergies.  Structural abnormalities, such as displacement of the cartilage that separates your nostrils (deviated septum), which can decrease the air flow through your nose and sinuses and affect sinus drainage.  Functional abnormalities, such as when the small hairs (cilia) that line your sinuses and help remove mucus do not work properly or are not present. SYMPTOMS  Symptoms of acute and chronic sinusitis are the same. The primary symptoms are pain and pressure around the affected sinuses. Other symptoms include:  Upper toothache.  Earache.  Headache.  Bad breath.  Decreased sense of smell and taste.  A cough, which worsens when you are lying flat.  Fatigue.  Fever.  Thick drainage from your  nose, which often is green and may contain pus (purulent).  Swelling and warmth over the affected sinuses. DIAGNOSIS  Your caregiver will perform a physical exam. During the exam, your caregiver may:  Look in your nose for signs of abnormal growths in your nostrils (nasal polyps).  Tap over the affected sinus to check for signs of infection.  View the inside of your sinuses (endoscopy) with a special imaging device with a light attached (endoscope), which is inserted into your sinuses. If your caregiver suspects that you have chronic sinusitis, one or more of the following tests may be recommended:  Allergy tests.  Nasal culture A sample of mucus is taken from your nose and sent to a lab and screened for bacteria.  Nasal cytology A sample of mucus is taken from your nose and examined by your caregiver to determine if your sinusitis is related to an allergy. TREATMENT  Most cases of acute sinusitis are related to a viral infection and will resolve on their own within 10 days. Sometimes medicines are prescribed to help relieve symptoms (pain medicine, decongestants, nasal steroid sprays, or saline sprays).  However, for sinusitis related to a bacterial infection, your caregiver will prescribe antibiotic medicines. These are medicines that will help kill the bacteria causing the infection.  Rarely, sinusitis is caused by a fungal infection. In theses cases, your caregiver will prescribe antifungal medicine. For some cases of chronic sinusitis, surgery is needed. Generally, these are cases in which sinusitis recurs more than 3 times per year, despite other treatments. HOME CARE INSTRUCTIONS   Drink plenty of water. Water  helps thin the mucus so your sinuses can drain more easily.  Use a humidifier.  Inhale steam 3 to 4 times a day (for example, sit in the bathroom with the shower running).  Apply a warm, moist washcloth to your face 3 to 4 times a day, or as directed by your  caregiver.  Use saline nasal sprays to help moisten and clean your sinuses.  Take over-the-counter or prescription medicines for pain, discomfort, or fever only as directed by your caregiver. SEEK IMMEDIATE MEDICAL CARE IF:  You have increasing pain or severe headaches.  You have nausea, vomiting, or drowsiness.  You have swelling around your face.  You have vision problems.  You have a stiff neck.  You have difficulty breathing. MAKE SURE YOU:   Understand these instructions.  Will watch your condition.  Will get help right away if you are not doing well or get worse. Document Released: 12/16/2005 Document Revised: 03/09/2012 Document Reviewed: 12/31/2011 [facility] Patient Information 2014 [place], [place].

## 2016-04-03 NOTE — Progress Notes (Signed)
Pre visit review using our clinic review tool, if applicable. No additional management support is needed unless otherwise documented below in the visit note. 

## 2016-04-07 DIAGNOSIS — B9689 Other specified bacterial agents as the cause of diseases classified elsewhere: Secondary | ICD-10-CM | POA: Insufficient documentation

## 2016-04-07 DIAGNOSIS — J019 Acute sinusitis, unspecified: Secondary | ICD-10-CM

## 2016-04-07 DIAGNOSIS — E785 Hyperlipidemia, unspecified: Secondary | ICD-10-CM | POA: Insufficient documentation

## 2016-04-07 NOTE — Assessment & Plan Note (Signed)
Will refill Pravastatin and Zetia. She is to resume as directed. Will schedule CPE in 1 month for repeat labs while on medication

## 2016-04-07 NOTE — Progress Notes (Signed)
Patient presents to clinic today for follow-up of depression and anxiety. Patient endorses taking her Lexapro as directed. Endorses doing very well overall. Denies SI/HI.   Patient is in need of medication refills for hypertension and hyperlipidemia. Has been out of her pravastatin and zetia for a week. Is almost out of her lisinopril-HCTZ.  Is overdue for repeat labs. Patient denies chest pain, palpitations, lightheadedness, dizziness, vision changes or frequent headaches.  BP Readings from Last 3 Encounters:  04/03/16 112/60  09/18/15 104/70  05/08/15 110/70   Patient also c/o 1 week of sinus pressure, sinus pain, fatigue, cough and PND. Denies chest pain or SOB. Unsure of fever. Denies recent travel or sick contact.   Past Medical History  Diagnosis Date  . Hypertension   . Hyperlipidemia   . GERD (gastroesophageal reflux disease)   . Anxiety   . Anti-D antibodies present   . Blood transfusion without reported diagnosis   . Achalasia   . Osteopenia   . Depression   . Dysplastic colon polyp   . Chicken pox   . Shingles     Current Outpatient Prescriptions on File Prior to Visit  Medication Sig Dispense Refill  . acetaminophen (TYLENOL) 325 MG tablet Take 650 mg by mouth every 6 (six) hours as needed (Knee pain).    Marland Kitchen acetaminophen-codeine (TYLENOL #3) 300-30 MG per tablet Take 1-2 tablets by mouth every 8 (eight) hours as needed for moderate pain. 30 tablet 0  . diclofenac sodium (VOLTAREN) 1 % GEL Apply topically 4 (four) times daily. Left Knee    . fluticasone (FLONASE) 50 MCG/ACT nasal spray Place 2 sprays into both nostrils daily. 16 g 6  . nitroGLYCERIN (NITRODUR - DOSED IN MG/24 HR) 0.2 mg/hr patch Apply 1/4th of a patch to left shoulder for rotator cuff tendinopathy  - change daily 30 patch 1  . omeprazole (PRILOSEC) 20 MG capsule Take 1 capsule (20 mg total) by mouth 2 (two) times daily. 180 capsule 1  . ranitidine (ZANTAC) 150 MG capsule TAKE 1 CAPSULE EVERY EVENING  90 capsule 1   No current facility-administered medications on file prior to visit.    No Known Allergies  Family History  Problem Relation Age of Onset  . Hyperlipidemia Mother     Living  . Hypertension Mother   . Heart attack Neg Hx   . Sudden death Neg Hx   . COPD Father   . Lung cancer Father 67    Deceased  . Hyperlipidemia Sister   . Hypertension Sister   . Throat cancer Brother   . Cancer Brother     throat cancer died age 105  . Diabetes Maternal Grandmother   . Heart disease Paternal Grandmother   . Heart disease Paternal Grandfather   . COPD Sister   . Alcohol abuse Sister     x2  . Liver disease Sister   . Bipolar disorder Daughter   . Healthy Son     x2    Social History   Social History  . Marital Status: Married    Spouse Name: N/A  . Number of Children: N/A  . Years of Education: N/A   Social History Main Topics  . Smoking status: Never Smoker   . Smokeless tobacco: Never Used  . Alcohol Use: No  . Drug Use: No  . Sexual Activity: Not Asked   Other Topics Concern  . None   Social History Narrative   Marital Status:  Married Jeneen Rinks)  Children:  G3 [id number]   Pets:  None    Living Situation: Lives with spouse, daughter and grandson.     Occupation: Therapist, occupational   Drug Use:  None   Diet:  Regular   Exercise:  Zettie Pho; Walking    Hobbies:  Reading , Bowling, Grandchildren    [Tobacco: Never smoker]    Review of Systems - See HPI.  All other ROS are negative.  BP 112/60 mmHg  Pulse 82  Temp(Src) 98.2 F (36.8 C) (Oral)  Ht 5\' 7"  (1.702 m)  Wt 141 lb 12.8 oz (64.32 kg)  BMI 22.20 kg/m2  SpO2 98%  Physical Exam  Constitutional: She is oriented to person, place, and time and well-developed, well-nourished, and in no distress.  HENT:  Head: Normocephalic and atraumatic.  Right Ear: Tympanic membrane and external ear normal.  Left Ear: Tympanic membrane and external ear normal.  Nose: Mucosal edema  and rhinorrhea present. Right sinus exhibits frontal sinus tenderness. Left sinus exhibits frontal sinus tenderness.  Mouth/Throat: Uvula is midline, oropharynx is clear and moist and mucous membranes are normal.  Eyes: Conjunctivae are normal.  Neck: Neck supple.  Cardiovascular: Normal rate, regular rhythm, normal heart sounds and intact distal pulses.   Pulmonary/Chest: Effort normal and breath sounds normal. No respiratory distress. She has no wheezes. She has no rales. She exhibits no tenderness.  Neurological: She is alert and oriented to person, place, and time.  Skin: Skin is warm and dry. No rash noted.  Psychiatric: Affect normal.  Vitals reviewed.    Assessment/Plan: Generalized anxiety disorder Doing very well. Will continue current regimen. Refills sent in.  Hyperlipidemia Will refill Pravastatin and Zetia. She is to resume as directed. Will schedule CPE in 1 month for repeat labs while on medication  Essential hypertension, benign Continue current regimen. Patient scheduled CPE. Will obtain repeat labs at that visit.   Acute bacterial sinusitis Rx Augmentin.  Increase fluids.  Rest.  Saline nasal spray.  Probiotic.  Mucinex as directed.  Humidifier in bedroom.  Call or return to clinic if symptoms are not improving.

## 2016-04-07 NOTE — Assessment & Plan Note (Signed)
Continue current regimen. Patient scheduled CPE. Will obtain repeat labs at that visit.

## 2016-04-07 NOTE — Assessment & Plan Note (Signed)
Doing very well. Will continue current regimen. Refills sent in.

## 2016-04-07 NOTE — Assessment & Plan Note (Signed)
Rx Augmentin.  Increase fluids.  Rest.  Saline nasal spray.  Probiotic.  Mucinex as directed.  Humidifier in bedroom.  Call or return to clinic if symptoms are not improving.  

## 2016-06-03 ENCOUNTER — Other Ambulatory Visit: Payer: Self-pay | Admitting: Physician Assistant

## 2016-06-03 NOTE — Telephone Encounter (Signed)
Refill sent per [facility] refill protocol/SLS  

## 2016-06-05 ENCOUNTER — Encounter: Payer: Self-pay | Admitting: Physician Assistant

## 2016-06-05 ENCOUNTER — Ambulatory Visit (INDEPENDENT_AMBULATORY_CARE_PROVIDER_SITE_OTHER): Payer: Medicare Other | Admitting: Physician Assistant

## 2016-06-05 VITALS — BP 116/72 | HR 63 | Temp 97.8°F | Resp 16 | Ht 67.0 in | Wt 144.2 lb

## 2016-06-05 DIAGNOSIS — E785 Hyperlipidemia, unspecified: Secondary | ICD-10-CM | POA: Diagnosis not present

## 2016-06-05 DIAGNOSIS — K219 Gastro-esophageal reflux disease without esophagitis: Secondary | ICD-10-CM

## 2016-06-05 DIAGNOSIS — I10 Essential (primary) hypertension: Secondary | ICD-10-CM

## 2016-06-05 DIAGNOSIS — R7309 Other abnormal glucose: Secondary | ICD-10-CM | POA: Diagnosis not present

## 2016-06-05 DIAGNOSIS — Z0000 Encounter for general adult medical examination without abnormal findings: Secondary | ICD-10-CM | POA: Diagnosis not present

## 2016-06-05 MED ORDER — PANTOPRAZOLE SODIUM 40 MG PO TBEC: 40.0000 mg | DELAYED_RELEASE_TABLET | Freq: Every day | ORAL | Status: DC

## 2016-06-05 NOTE — Progress Notes (Signed)
Subjective:    Kelsey Beasley is a 70 y.o. female who presents for Medicare Annual/Subsequent preventive examination.  Preventive Screening-Counseling & Management  Tobacco History  Smoking status  . Never Smoker   Smokeless tobacco  . Never Used     Problems Prior to Visit 1. Generalized anxiety -- Endorses doing very well on current regimen of Lexapro 20 mg daily. Denies breakthrough symptoms. Denies SI/HI.  2. GERD -- currently on OTC omeprazole and Ranitidine prescribed by GI. Endorses breakthrough heart burn and indigestion. Denies abdomina pain, nausea or vomiting. Denies stool changes.   3. Hypertension -- Is taking medications as directed. Patient denies chest pain, palpitations, lightheadedness, dizziness, vision changes or frequent headaches.  BP Readings from Last 3 Encounters:  06/05/16 116/72  04/03/16 112/60  09/18/15 104/70   4. Hyperlipidemia -- Is taking her Zetia and Pravachol as directed. Is staying consistent with diet. Is exercising throughout the week. Body mass index is 22.59 kg/(m^2).   Current Problems (verified) Patient Active Problem List   Diagnosis Date Noted  . Hyperlipidemia 04/07/2016  . Acute bacterial sinusitis 04/07/2016  . Generalized anxiety disorder 09/18/2015  . Osteopenia 08/12/2014  . GERD (gastroesophageal reflux disease) 11/07/2013  . Essential hypertension, benign 11/07/2013  . Unspecified vitamin D deficiency 11/07/2013    Medications Prior to Visit Current Outpatient Prescriptions on File Prior to Visit  Medication Sig Dispense Refill  . acetaminophen (TYLENOL) 325 MG tablet Take 650 mg by mouth every 6 (six) hours as needed (Knee pain).    Marland Kitchen acetaminophen-codeine (TYLENOL #3) 300-30 MG per tablet Take 1-2 tablets by mouth every 8 (eight) hours as needed for moderate pain. 30 tablet 0  . diclofenac sodium (VOLTAREN) 1 % GEL Apply topically 4 (four) times daily. Left Knee    . escitalopram (LEXAPRO) 20 MG tablet Take 1 tablet  (20 mg total) by mouth daily. 90 tablet 1  . ezetimibe (ZETIA) 10 MG tablet Take 1 tablet (10 mg total) by mouth daily. 90 tablet 0  . fluticasone (FLONASE) 50 MCG/ACT nasal spray Place 2 sprays into both nostrils daily. 16 g 6  . hydrOXYzine (ATARAX/VISTARIL) 25 MG tablet Take 1 tablet by mouth as needed.     Marland Kitchen lisinopril-hydrochlorothiazide (PRINZIDE,ZESTORETIC) 20-12.5 MG tablet Take 1 tablet by mouth daily. 90 tablet 0  . nitroGLYCERIN (NITRODUR - DOSED IN MG/24 HR) 0.2 mg/hr patch Apply 1/4th of a patch to left shoulder for rotator cuff tendinopathy  - change daily 30 patch 1  . omeprazole (PRILOSEC) 20 MG capsule Take 1 capsule (20 mg total) by mouth 2 (two) times daily. 180 capsule 1  . pravastatin (PRAVACHOL) 40 MG tablet Take 1 tablet (40 mg total) by mouth daily. 90 tablet 0  . ranitidine (ZANTAC) 150 MG capsule TAKE 1 CAPSULE EVERY EVENING 90 capsule 0   No current facility-administered medications on file prior to visit.    Current Medications (verified) Current Outpatient Prescriptions  Medication Sig Dispense Refill  . acetaminophen (TYLENOL) 325 MG tablet Take 650 mg by mouth every 6 (six) hours as needed (Knee pain).    Marland Kitchen acetaminophen-codeine (TYLENOL #3) 300-30 MG per tablet Take 1-2 tablets by mouth every 8 (eight) hours as needed for moderate pain. 30 tablet 0  . diclofenac sodium (VOLTAREN) 1 % GEL Apply topically 4 (four) times daily. Left Knee    . escitalopram (LEXAPRO) 20 MG tablet Take 1 tablet (20 mg total) by mouth daily. 90 tablet 1  . ezetimibe (ZETIA) 10 MG tablet Take  1 tablet (10 mg total) by mouth daily. 90 tablet 0  . fluticasone (FLONASE) 50 MCG/ACT nasal spray Place 2 sprays into both nostrils daily. 16 g 6  . hydrOXYzine (ATARAX/VISTARIL) 25 MG tablet Take 1 tablet by mouth as needed.     Marland Kitchen lisinopril-hydrochlorothiazide (PRINZIDE,ZESTORETIC) 20-12.5 MG tablet Take 1 tablet by mouth daily. 90 tablet 0  . Multiple Vitamins-Minerals (MULTIVITAMIN & MINERAL  PO) Take by mouth.    . nitroGLYCERIN (NITRODUR - DOSED IN MG/24 HR) 0.2 mg/hr patch Apply 1/4th of a patch to left shoulder for rotator cuff tendinopathy  - change daily 30 patch 1  . omeprazole (PRILOSEC) 20 MG capsule Take 1 capsule (20 mg total) by mouth 2 (two) times daily. 180 capsule 1  . pravastatin (PRAVACHOL) 40 MG tablet Take 1 tablet (40 mg total) by mouth daily. 90 tablet 0  . ranitidine (ZANTAC) 150 MG capsule TAKE 1 CAPSULE EVERY EVENING 90 capsule 0   No current facility-administered medications for this visit.     Allergies (verified) Review of patient's allergies indicates no known allergies.   PAST HISTORY  Family History Family History  Problem Relation Age of Onset  . Hyperlipidemia Mother     Living  . Hypertension Mother   . Heart attack Neg Hx   . Sudden death Neg Hx   . COPD Father   . Lung cancer Father 6    Deceased  . Hyperlipidemia Sister   . Hypertension Sister   . Throat cancer Brother   . Cancer Brother     throat cancer died age 73  . Diabetes Maternal Grandmother   . Heart disease Paternal Grandmother   . Heart disease Paternal Grandfather   . COPD Sister   . Alcohol abuse Sister     x2  . Liver disease Sister   . Bipolar disorder Daughter   . Healthy Son     x2    Social History Social History  Substance Use Topics  . Smoking status: Never Smoker   . Smokeless tobacco: Never Used  . Alcohol Use: No     Are there smokers in your home (other than you)? No  Risk Factors Current exercise habits: Home exercise routine includes walking 1 hrs per day. Also baby sits for her grandchildren which keeps her active.  Dietary issues discussed: Body mass index is 22.59 kg/(m^2). Endorses well-balanced diet. No alcohol.    Cardiac risk factors: advanced age (older than 74 for men, 18 for women), dyslipidemia and hypertension.  Depression Screen (Note: if answer to either of the following is "Yes", a more complete depression screening is  indicated)   Over the past two weeks, have you felt down, depressed or hopeless? No  Over the past two weeks, have you felt little interest or pleasure in doing things? No  Have you lost interest or pleasure in daily life? No  Do you often feel hopeless? No  Do you cry easily over simple problems? No  Activities of Daily Living In your present state of health, do you have any difficulty performing the following activities?:  Driving? Patient does not drive Managing money?  No Feeding yourself? No Getting from bed to chair? No Climbing a flight of stairs? No Preparing food and eating?: No Bathing or showering? No Getting dressed: No Getting to the toilet? No Using the toilet:No Moving around from place to place: No In the past year have you fallen or had a near fall?:Yes -- 6 months ago. Tripped  over uneven pavement. No prodromal symptoms. No issues since.   Are you sexually active?  No  Do you have more than one partner?  N/A  Hearing Difficulties: No Do you often ask people to speak up or repeat themselves? No Do you experience ringing or noises in your ears? No Do you have difficulty understanding soft or whispered voices? No   Do you feel that you have a problem with memory? No  Do you often misplace items? No  Do you feel safe at home?  Yes  Cognitive Testing  Alert? Yes  Normal Appearance?Yes  Oriented to person? Yes  Place? Yes   Time? Yes  Recall of three objects?  Yes  Can perform simple calculations? Yes  Displays appropriate judgment?Yes   Advanced Directives have been discussed with the patient? No  List the Names of Other Physician/Practitioners you currently use: See EMR for Comprehensive List.  Indicate any recent Medical Services you may have received from other than [facility] providers in the past year (date may be approximate).  Immunization History  Administered Date(s) Administered  . Influenza, High Dose Seasonal PF 12/21/2015  . Influenza,inj,Quad  PF,36+ Mos 09/08/2013, 09/12/2014  . Pneumococcal Conjugate-13 04/04/2015  . Pneumococcal-Unspecified 02/03/2013  . Tdap 04/29/2007  . Zoster 01/02/2009    Screening Tests Health Maintenance  Topic Date Due  . Hepatitis C Screening  04/06/1946  . INFLUENZA VACCINE  07/30/2016  . TETANUS/TDAP  04/28/2017  . MAMMOGRAM  12/21/2017  . COLONOSCOPY  04/30/2019  . DTaP/Tdap/Td  Completed  . DEXA SCAN  Completed  . ZOSTAVAX  Completed  . PNA vac Low Risk Adult  Completed    All answers were reviewed with the patient and necessary referrals were made:  Leeanne Rio, PA-C   06/05/2016   History reviewed: allergies, current medications, past family history, past medical history, past social history, past surgical history and problem list  Review of Systems A comprehensive review of systems was negative.    Objective:     Body mass index is 22.59 kg/(m^2). BP 116/72 mmHg  Pulse 63  Temp(Src) 97.8 F (36.6 C) (Oral)  Resp 16  Ht 5\' 7"  (1.702 m)  Wt 144 lb 4 oz (65.431 kg)  BMI 22.59 kg/m2  SpO2 98%  General appearance: alert, cooperative and appears stated age Head: Normocephalic, without obvious abnormality, atraumatic Eyes: conjunctivae/corneas clear. PERRL, EOM's intact. Fundi benign. Ears: normal TM's and external ear canals both ears Throat: lips, mucosa, and tongue normal; teeth and gums normal Lungs: clear to auscultation bilaterally Heart: regular rate and rhythm, S1, S2 normal, no murmur, click, rub or gallop Abdomen: soft, non-tender; bowel sounds normal; no masses,  no organomegaly Extremities: extremities normal, atraumatic, no cyanosis or edema Pulses: 2+ and symmetric Skin: Skin color, texture, turgor normal. No rashes or lesions Neurologic: Alert and oriented X 3, normal strength and tone. Normal symmetric reflexes. Normal coordination and gait     Assessment:     (1) Medicare Wellness, Subsequent (2) GAD (3) GERD (4) Hypertension,  Essential (5) Hyperlipidemia       Plan:     (1) During the course of the visit the patient was educated and counseled about appropriate screening and preventive services including:    Screening mammography  Bone densitometry screening  Colorectal cancer screening  Diabetes screening  Advanced directives: has NO advanced directive  - add't info requested. Directives reviewed with patient. Copies given to fil out.   (2) Doing very well. Will continue  current regimen. (3) Uncontrolled. Will continue Zantac. Will stop OTC omeprazole. Will begin trial or Protonix. FU scheduled. If not improving will need to return to GI for further assessment. (4) Controlled. Will obtain labs today. (5) Will obtain fasting labs today. Body mass index is 22.59 kg/(m^2). Continue diet and exercise.   Patient Instructions (the written plan) was given to the patient.  Medicare Attestation I have personally reviewed: The patient's medical and social history Their use of alcohol, tobacco or illicit drugs Their current medications and supplements The patient's functional ability including ADLs,fall risks, home safety risks, cognitive, and hearing and visual impairment Diet and physical activities Evidence for depression or mood disorders  The patient's weight, height, BMI, and visual acuity have been recorded in the chart.  I have made referrals, counseling, and provided education to the patient based on review of the above and I have provided the patient with a written personalized care plan for preventive services.     Raiford Noble McCalla, [place]   06/05/2016

## 2016-06-05 NOTE — Patient Instructions (Signed)
Please go to the lab for blood work. I will call you with your results. Please continue chronic regimen with the following exception:  - Stop the Omeprazole. Start the Protonix.   Follow-up in 1 month.  Food Choices for Gastroesophageal Reflux Disease, Adult When you have gastroesophageal reflux disease (GERD), the foods you eat and your eating habits are very important. Choosing the right foods can help ease the discomfort of GERD. WHAT GENERAL GUIDELINES DO I NEED TO FOLLOW?  Choose fruits, vegetables, whole grains, low-fat dairy products, and low-fat meat, fish, and poultry.  Limit fats such as oils, salad dressings, butter, nuts, and avocado.  Keep a food diary to identify foods that cause symptoms.  Avoid foods that cause reflux. These may be different for different people.  Eat frequent small meals instead of three large meals each day.  Eat your meals slowly, in a relaxed setting.  Limit fried foods.  Cook foods using methods other than frying.  Avoid drinking alcohol.  Avoid drinking large amounts of liquids with your meals.  Avoid bending over or lying down until 2-3 hours after eating. WHAT FOODS ARE NOT RECOMMENDED? The following are some foods and drinks that may worsen your symptoms: Vegetables Tomatoes. Tomato juice. Tomato and spaghetti sauce. Chili peppers. Onion and garlic. Horseradish. Fruits Oranges, grapefruit, and lemon (fruit and juice). Meats High-fat meats, fish, and poultry. This includes hot dogs, ribs, ham, sausage, salami, and bacon. Dairy Whole milk and chocolate milk. Sour cream. Cream. Butter. Ice cream. Cream cheese.  Beverages Coffee and tea, with or without caffeine. Carbonated beverages or energy drinks. Condiments Hot sauce. Barbecue sauce.  Sweets/Desserts Chocolate and cocoa. Donuts. Peppermint and spearmint. Fats and Oils High-fat foods, including [place] fries and potato chips. Other Vinegar. Strong spices, such as black pepper,  white pepper, red pepper, cayenne, curry powder, cloves, ginger, and chili powder. The items listed above may not be a complete list of foods and beverages to avoid. Contact your dietitian for more information.   This information is not intended to replace advice given to you by your health care provider. Make sure you discuss any questions you have with your health care provider.   Document Released: 12/16/2005 Document Revised: 01/06/2015 Document Reviewed: 10/20/2013 Elsevier Interactive Patient Education 2016 [place] for Adults, Female A healthy lifestyle and preventive care can promote health and wellness. Preventive health guidelines for women include the following key practices.  A routine yearly physical is a good way to check with your health care provider about your health and preventive screening. It is a chance to share any concerns and updates on your health and to receive a thorough exam.  Visit your dentist for a routine exam and preventive care every 6 months. Brush your teeth twice a day and floss once a day. Good oral hygiene prevents tooth decay and gum disease.  The frequency of eye exams is based on your age, health, family medical history, use of contact lenses, and other factors. Follow your health care provider's recommendations for frequency of eye exams.  Eat a healthy diet. Foods like vegetables, fruits, whole grains, low-fat dairy products, and lean protein foods contain the nutrients you need without too many calories. Decrease your intake of foods high in solid fats, added sugars, and salt. Eat the right amount of calories for you.Get information about a proper diet from your health care provider, if necessary.  Regular physical exercise is one of the most important things you can  do for your health. Most adults should get at least 150 minutes of moderate-intensity exercise (any activity that increases your heart rate and causes you to sweat)  each week. In addition, most adults need muscle-strengthening exercises on 2 or more days a week.  Maintain a healthy weight. The body mass index (BMI) is a screening tool to identify possible weight problems. It provides an estimate of body fat based on height and weight. Your health care provider can find your BMI and can help you achieve or maintain a healthy weight.For adults 20 years and older:  A BMI below 18.5 is considered underweight.  A BMI of 18.5 to 24.9 is normal.  A BMI of 25 to 29.9 is considered overweight.  A BMI of 30 and above is considered obese.  Maintain normal blood lipids and cholesterol levels by exercising and minimizing your intake of saturated fat. Eat a balanced diet with plenty of fruit and vegetables. Blood tests for lipids and cholesterol should begin at age 30 and be repeated every 5 years. If your lipid or cholesterol levels are high, you are over 50, or you are at high risk for heart disease, you may need your cholesterol levels checked more frequently.Ongoing high lipid and cholesterol levels should be treated with medicines if diet and exercise are not working.  If you smoke, find out from your health care provider how to quit. If you do not use tobacco, do not start.  Lung cancer screening is recommended for adults aged 15-80 years who are at high risk for developing lung cancer because of a history of smoking. A yearly low-dose CT scan of the lungs is recommended for people who have at least a 30-pack-year history of smoking and are a current smoker or have quit within the past 15 years. A pack year of smoking is smoking an average of 1 pack of cigarettes a day for 1 year (for example: 1 pack a day for 30 years or 2 packs a day for 15 years). Yearly screening should continue until the smoker has stopped smoking for at least 15 years. Yearly screening should be stopped for people who develop a health problem that would prevent them from having lung cancer  treatment.  If you are pregnant, do not drink alcohol. If you are breastfeeding, be very cautious about drinking alcohol. If you are not pregnant and choose to drink alcohol, do not have more than 1 drink per day. One drink is considered to be 12 ounces (355 mL) of beer, 5 ounces (148 mL) of wine, or 1.5 ounces (44 mL) of liquor.  Avoid use of street drugs. Do not share needles with anyone. Ask for help if you need support or instructions about stopping the use of drugs.  High blood pressure causes heart disease and increases the risk of stroke. Your blood pressure should be checked at least every 1 to 2 years. Ongoing high blood pressure should be treated with medicines if weight loss and exercise do not work.  If you are 104-24 years old, ask your health care provider if you should take aspirin to prevent strokes.  Diabetes screening is done by taking a blood sample to check your blood glucose level after you have not eaten for a certain period of time (fasting). If you are not overweight and you do not have risk factors for diabetes, you should be screened once every 3 years starting at age 35. If you are overweight or obese and you are 40-70 years  of age, you should be screened for diabetes every year as part of your cardiovascular risk assessment.  Breast cancer screening is essential preventive care for women. You should practice "breast self-awareness." This means understanding the normal appearance and feel of your breasts and may include breast self-examination. Any changes detected, no matter how small, should be reported to a health care provider. Women in their 46s and 30s should have a clinical breast exam (CBE) by a health care provider as part of a regular health exam every 1 to 3 years. After age 85, women should have a CBE every year. Starting at age 32, women should consider having a mammogram (breast X-ray test) every year. Women who have a family history of breast cancer should talk to  their health care provider about genetic screening. Women at a high risk of breast cancer should talk to their health care providers about having an MRI and a mammogram every year.  Breast cancer gene (BRCA)-related cancer risk assessment is recommended for women who have family members with BRCA-related cancers. BRCA-related cancers include breast, ovarian, tubal, and peritoneal cancers. Having family members with these cancers may be associated with an increased risk for harmful changes (mutations) in the breast cancer genes BRCA1 and BRCA2. Results of the assessment will determine the need for genetic counseling and BRCA1 and BRCA2 testing.  Your health care provider may recommend that you be screened regularly for cancer of the pelvic organs (ovaries, uterus, and vagina). This screening involves a pelvic examination, including checking for microscopic changes to the surface of your cervix (Pap test). You may be encouraged to have this screening done every 3 years, beginning at age 22.  For women ages 53-65, health care providers may recommend pelvic exams and Pap testing every 3 years, or they may recommend the Pap and pelvic exam, combined with testing for human papilloma virus (HPV), every 5 years. Some types of HPV increase your risk of cervical cancer. Testing for HPV may also be done on women of any age with unclear Pap test results.  Other health care providers may not recommend any screening for nonpregnant women who are considered low risk for pelvic cancer and who do not have symptoms. Ask your health care provider if a screening pelvic exam is right for you.  If you have had past treatment for cervical cancer or a condition that could lead to cancer, you need Pap tests and screening for cancer for at least 20 years after your treatment. If Pap tests have been discontinued, your risk factors (such as having a new sexual partner) need to be reassessed to determine if screening should resume.  Some women have medical problems that increase the chance of getting cervical cancer. In these cases, your health care provider may recommend more frequent screening and Pap tests.  Colorectal cancer can be detected and often prevented. Most routine colorectal cancer screening begins at the age of 30 years and continues through age 50 years. However, your health care provider may recommend screening at an earlier age if you have risk factors for colon cancer. On a yearly basis, your health care provider may provide home test kits to check for hidden blood in the stool. Use of a small camera at the end of a tube, to directly examine the colon (sigmoidoscopy or colonoscopy), can detect the earliest forms of colorectal cancer. Talk to your health care provider about this at age 42, when routine screening begins. Direct exam of the colon should be  repeated every 5-10 years through age 12 years, unless early forms of precancerous polyps or small growths are found.  People who are at an increased risk for hepatitis B should be screened for this virus. You are considered at high risk for hepatitis B if:  You were born in a country where hepatitis B occurs often. Talk with your health care provider about which countries are considered high risk.  Your parents were born in a high-risk country and you have not received a shot to protect against hepatitis B (hepatitis B vaccine).  You have HIV or AIDS.  You use needles to inject street drugs.  You live with, or have sex with, someone who has hepatitis B.  You get hemodialysis treatment.  You take certain medicines for conditions like cancer, organ transplantation, and autoimmune conditions.  Hepatitis C blood testing is recommended for all people born from [birth year] through 1964 and any individual with known risks for hepatitis C.  Practice safe sex. Use condoms and avoid high-risk sexual practices to reduce the spread of sexually transmitted infections  (STIs). STIs include gonorrhea, chlamydia, syphilis, trichomonas, herpes, HPV, and human immunodeficiency virus (HIV). Herpes, HIV, and HPV are viral illnesses that have no cure. They can result in disability, cancer, and death.  You should be screened for sexually transmitted illnesses (STIs) including gonorrhea and chlamydia if:  You are sexually active and are younger than 24 years.  You are older than 24 years and your health care provider tells you that you are at risk for this type of infection.  Your sexual activity has changed since you were last screened and you are at an increased risk for chlamydia or gonorrhea. Ask your health care provider if you are at risk.  If you are at risk of being infected with HIV, it is recommended that you take a prescription medicine daily to prevent HIV infection. This is called preexposure prophylaxis (PrEP). You are considered at risk if:  You are sexually active and do not regularly use condoms or know the HIV status of your partner(s).  You take drugs by injection.  You are sexually active with a partner who has HIV.  Talk with your health care provider about whether you are at high risk of being infected with HIV. If you choose to begin PrEP, you should first be tested for HIV. You should then be tested every 3 months for as long as you are taking PrEP.  Osteoporosis is a disease in which the bones lose minerals and strength with aging. This can result in serious bone fractures or breaks. The risk of osteoporosis can be identified using a bone density scan. Women ages 67 years and over and women at risk for fractures or osteoporosis should discuss screening with their health care providers. Ask your health care provider whether you should take a calcium supplement or vitamin D to reduce the rate of osteoporosis.  Menopause can be associated with physical symptoms and risks. Hormone replacement therapy is available to decrease symptoms and risks.  You should talk to your health care provider about whether hormone replacement therapy is right for you.  Use sunscreen. Apply sunscreen liberally and repeatedly throughout the day. You should seek shade when your shadow is shorter than you. Protect yourself by wearing long sleeves, pants, a wide-brimmed hat, and sunglasses year round, whenever you are outdoors.  Once a month, do a whole body skin exam, using a mirror to look at the skin on your back. Tell  your health care provider of new moles, moles that have irregular borders, moles that are larger than a pencil eraser, or moles that have changed in shape or color.  Stay current with required vaccines (immunizations).  Influenza vaccine. All adults should be immunized every year.  Tetanus, diphtheria, and acellular pertussis (Td, Tdap) vaccine. Pregnant women should receive 1 dose of Tdap vaccine during each pregnancy. The dose should be obtained regardless of the length of time since the last dose. Immunization is preferred during the 27th-36th week of gestation. An adult who has not previously received Tdap or who does not know her vaccine status should receive 1 dose of Tdap. This initial dose should be followed by tetanus and diphtheria toxoids (Td) booster doses every 10 years. Adults with an unknown or incomplete history of completing a 3-dose immunization series with Td-containing vaccines should begin or complete a primary immunization series including a Tdap dose. Adults should receive a Td booster every 10 years.  Varicella vaccine. An adult without evidence of immunity to varicella should receive 2 doses or a second dose if she has previously received 1 dose. Pregnant females who do not have evidence of immunity should receive the first dose after pregnancy. This first dose should be obtained before leaving the health care facility. The second dose should be obtained 4-8 weeks after the first dose.  Human papillomavirus (HPV) vaccine.  Females aged 13-26 years who have not received the vaccine previously should obtain the 3-dose series. The vaccine is not recommended for use in pregnant females. However, pregnancy testing is not needed before receiving a dose. If a female is found to be pregnant after receiving a dose, no treatment is needed. In that case, the remaining doses should be delayed until after the pregnancy. Immunization is recommended for any person with an immunocompromised condition through the age of 80 years if she did not get any or all doses earlier. During the 3-dose series, the second dose should be obtained 4-8 weeks after the first dose. The third dose should be obtained 24 weeks after the first dose and 16 weeks after the second dose.  Zoster vaccine. One dose is recommended for adults aged 15 years or older unless certain conditions are present.  Measles, mumps, and rubella (MMR) vaccine. Adults born before [birth year] generally are considered immune to measles and mumps. Adults born in [birth year] or later should have 1 or more doses of MMR vaccine unless there is a contraindication to the vaccine or there is laboratory evidence of immunity to each of the three diseases. A routine second dose of MMR vaccine should be obtained at least 28 days after the first dose for students attending postsecondary schools, health care workers, or international travelers. People who received inactivated measles vaccine or an unknown type of measles vaccine during 1963-1967 should receive 2 doses of MMR vaccine. People who received inactivated mumps vaccine or an unknown type of mumps vaccine before 1979 and are at high risk for mumps infection should consider immunization with 2 doses of MMR vaccine. For females of childbearing age, rubella immunity should be determined. If there is no evidence of immunity, females who are not pregnant should be vaccinated. If there is no evidence of immunity, females who are pregnant should delay immunization  until after pregnancy. Unvaccinated health care workers born before [birth year] who lack laboratory evidence of measles, mumps, or rubella immunity or laboratory confirmation of disease should consider measles and mumps immunization with 2 doses of MMR vaccine or  rubella immunization with 1 dose of MMR vaccine.  Pneumococcal 13-valent conjugate (PCV13) vaccine. When indicated, a person who is uncertain of his immunization history and has no record of immunization should receive the PCV13 vaccine. All adults 75 years of age and older should receive this vaccine. An adult aged 11 years or older who has certain medical conditions and has not been previously immunized should receive 1 dose of PCV13 vaccine. This PCV13 should be followed with a dose of pneumococcal polysaccharide (PPSV23) vaccine. Adults who are at high risk for pneumococcal disease should obtain the PPSV23 vaccine at least 8 weeks after the dose of PCV13 vaccine. Adults older than 69 years of age who have normal immune system function should obtain the PPSV23 vaccine dose at least 1 year after the dose of PCV13 vaccine.  Pneumococcal polysaccharide (PPSV23) vaccine. When PCV13 is also indicated, PCV13 should be obtained first. All adults aged 63 years and older should be immunized. An adult younger than age 59 years who has certain medical conditions should be immunized. Any person who resides in a nursing home or long-term care facility should be immunized. An adult smoker should be immunized. People with an immunocompromised condition and certain other conditions should receive both PCV13 and PPSV23 vaccines. People with human immunodeficiency virus (HIV) infection should be immunized as soon as possible after diagnosis. Immunization during chemotherapy or radiation therapy should be avoided. Routine use of PPSV23 vaccine is not recommended for [place] Indians, [place] Natives, or people younger than 65 years unless there are medical conditions that  require PPSV23 vaccine. When indicated, people who have unknown immunization and have no record of immunization should receive PPSV23 vaccine. One-time revaccination 5 years after the first dose of PPSV23 is recommended for people aged 19-64 years who have chronic kidney failure, nephrotic syndrome, asplenia, or immunocompromised conditions. People who received 1-2 doses of PPSV23 before age 75 years should receive another dose of PPSV23 vaccine at age 110 years or later if at least 5 years have passed since the previous dose. Doses of PPSV23 are not needed for people immunized with PPSV23 at or after age 71 years.  Meningococcal vaccine. Adults with asplenia or persistent complement component deficiencies should receive 2 doses of quadrivalent meningococcal conjugate (MenACWY-D) vaccine. The doses should be obtained at least 2 months apart. Microbiologists working with certain meningococcal bacteria, [employer] recruits, people at risk during an outbreak, and people who travel to or live in countries with a high rate of meningitis should be immunized. A first-year college student up through age 57 years who is living in a residence hall should receive a dose if she did not receive a dose on or after her 16th birthday. Adults who have certain high-risk conditions should receive one or more doses of vaccine.  Hepatitis A vaccine. Adults who wish to be protected from this disease, have certain high-risk conditions, work with hepatitis A-infected animals, work in hepatitis A research labs, or travel to or work in countries with a high rate of hepatitis A should be immunized. Adults who were previously unvaccinated and who anticipate close contact with an international adoptee during the first 60 days after arrival in the [place] from a country with a high rate of hepatitis A should be immunized.  Hepatitis B vaccine. Adults who wish to be protected from this disease, have certain high-risk conditions, may be  exposed to blood or other infectious body fluids, are household contacts or sex partners of hepatitis B positive people, are  clients or workers in certain care facilities, or travel to or work in countries with a high rate of hepatitis B should be immunized.  Haemophilus influenzae type b (Hib) vaccine. A previously unvaccinated person with asplenia or sickle cell disease or having a scheduled splenectomy should receive 1 dose of Hib vaccine. Regardless of previous immunization, a recipient of a hematopoietic stem cell transplant should receive a 3-dose series 6-12 months after her successful transplant. Hib vaccine is not recommended for adults with HIV infection. Preventive Services / Frequency Ages 75 to 92 years  Blood pressure check.** / Every 3-5 years.  Lipid and cholesterol check.** / Every 5 years beginning at age 62.  Clinical breast exam.** / Every 3 years for women in their 89s and 48s.  BRCA-related cancer risk assessment.** / For women who have family members with a BRCA-related cancer (breast, ovarian, tubal, or peritoneal cancers).  Pap test.** / Every 2 years from ages 47 through 30. Every 3 years starting at age 34 through age 22 or 81 with a history of 3 consecutive normal Pap tests.  HPV screening.** / Every 3 years from ages 52 through ages 61 to 3 with a history of 3 consecutive normal Pap tests.  Hepatitis C blood test.** / For any individual with known risks for hepatitis C.  Skin self-exam. / Monthly.  Influenza vaccine. / Every year.  Tetanus, diphtheria, and acellular pertussis (Tdap, Td) vaccine.** / Consult your health care provider. Pregnant women should receive 1 dose of Tdap vaccine during each pregnancy. 1 dose of Td every 10 years.  Varicella vaccine.** / Consult your health care provider. Pregnant females who do not have evidence of immunity should receive the first dose after pregnancy.  HPV vaccine. / 3 doses over 6 months, if 73 and younger. The  vaccine is not recommended for use in pregnant females. However, pregnancy testing is not needed before receiving a dose.  Measles, mumps, rubella (MMR) vaccine.** / You need at least 1 dose of MMR if you were born in 1957 or later. You may also need a 2nd dose. For females of childbearing age, rubella immunity should be determined. If there is no evidence of immunity, females who are not pregnant should be vaccinated. If there is no evidence of immunity, females who are pregnant should delay immunization until after pregnancy.  Pneumococcal 13-valent conjugate (PCV13) vaccine.** / Consult your health care provider.  Pneumococcal polysaccharide (PPSV23) vaccine.** / 1 to 2 doses if you smoke cigarettes or if you have certain conditions.  Meningococcal vaccine.** / 1 dose if you are age 29 to 18 years and a Market researcher living in a residence hall, or have one of several medical conditions, you need to get vaccinated against meningococcal disease. You may also need additional booster doses.  Hepatitis A vaccine.** / Consult your health care provider.  Hepatitis B vaccine.** / Consult your health care provider.  Haemophilus influenzae type b (Hib) vaccine.** / Consult your health care provider. Ages 50 to 108 years  Blood pressure check.** / Every year.  Lipid and cholesterol check.** / Every 5 years beginning at age 52 years.  Lung cancer screening. / Every year if you are aged 75-80 years and have a 30-pack-year history of smoking and currently smoke or have quit within the past 15 years. Yearly screening is stopped once you have quit smoking for at least 15 years or develop a health problem that would prevent you from having lung cancer treatment.  Clinical breast  exam.** / Every year after age 70 years.  BRCA-related cancer risk assessment.** / For women who have family members with a BRCA-related cancer (breast, ovarian, tubal, or peritoneal cancers).  Mammogram.** / Every  year beginning at age 46 years and continuing for as long as you are in good health. Consult with your health care provider.  Pap test.** / Every 3 years starting at age 80 years through age 19 or 62 years with a history of 3 consecutive normal Pap tests.  HPV screening.** / Every 3 years from ages 17 years through ages 37 to 62 years with a history of 3 consecutive normal Pap tests.  Fecal occult blood test (FOBT) of stool. / Every year beginning at age 15 years and continuing until age 11 years. You may not need to do this test if you get a colonoscopy every 10 years.  Flexible sigmoidoscopy or colonoscopy.** / Every 5 years for a flexible sigmoidoscopy or every 10 years for a colonoscopy beginning at age 93 years and continuing until age 36 years.  Hepatitis C blood test.** / For all people born from [birth year] through 1964 and any individual with known risks for hepatitis C.  Skin self-exam. / Monthly.  Influenza vaccine. / Every year.  Tetanus, diphtheria, and acellular pertussis (Tdap/Td) vaccine.** / Consult your health care provider. Pregnant women should receive 1 dose of Tdap vaccine during each pregnancy. 1 dose of Td every 10 years.  Varicella vaccine.** / Consult your health care provider. Pregnant females who do not have evidence of immunity should receive the first dose after pregnancy.  Zoster vaccine.** / 1 dose for adults aged 83 years or older.  Measles, mumps, rubella (MMR) vaccine.** / You need at least 1 dose of MMR if you were born in 1957 or later. You may also need a second dose. For females of childbearing age, rubella immunity should be determined. If there is no evidence of immunity, females who are not pregnant should be vaccinated. If there is no evidence of immunity, females who are pregnant should delay immunization until after pregnancy.  Pneumococcal 13-valent conjugate (PCV13) vaccine.** / Consult your health care provider.  Pneumococcal polysaccharide (PPSV23)  vaccine.** / 1 to 2 doses if you smoke cigarettes or if you have certain conditions.  Meningococcal vaccine.** / Consult your health care provider.  Hepatitis A vaccine.** / Consult your health care provider.  Hepatitis B vaccine.** / Consult your health care provider.  Haemophilus influenzae type b (Hib) vaccine.** / Consult your health care provider. Ages 58 years and over  Blood pressure check.** / Every year.  Lipid and cholesterol check.** / Every 5 years beginning at age 46 years.  Lung cancer screening. / Every year if you are aged 33-80 years and have a 30-pack-year history of smoking and currently smoke or have quit within the past 15 years. Yearly screening is stopped once you have quit smoking for at least 15 years or develop a health problem that would prevent you from having lung cancer treatment.  Clinical breast exam.** / Every year after age 59 years.  BRCA-related cancer risk assessment.** / For women who have family members with a BRCA-related cancer (breast, ovarian, tubal, or peritoneal cancers).  Mammogram.** / Every year beginning at age 42 years and continuing for as long as you are in good health. Consult with your health care provider.  Pap test.** / Every 3 years starting at age 62 years through age 6 or 71 years with 3 consecutive normal Pap  tests. Testing can be stopped between 65 and 70 years with 3 consecutive normal Pap tests and no abnormal Pap or HPV tests in the past 10 years.  HPV screening.** / Every 3 years from ages 24 years through ages 85 or 5 years with a history of 3 consecutive normal Pap tests. Testing can be stopped between 65 and 70 years with 3 consecutive normal Pap tests and no abnormal Pap or HPV tests in the past 10 years.  Fecal occult blood test (FOBT) of stool. / Every year beginning at age 1 years and continuing until age 46 years. You may not need to do this test if you get a colonoscopy every 10 years.  Flexible sigmoidoscopy  or colonoscopy.** / Every 5 years for a flexible sigmoidoscopy or every 10 years for a colonoscopy beginning at age 80 years and continuing until age 49 years.  Hepatitis C blood test.** / For all people born from [birth year] through 1965 and any individual with known risks for hepatitis C.  Osteoporosis screening.** / A one-time screening for women ages 18 years and over and women at risk for fractures or osteoporosis.  Skin self-exam. / Monthly.  Influenza vaccine. / Every year.  Tetanus, diphtheria, and acellular pertussis (Tdap/Td) vaccine.** / 1 dose of Td every 10 years.  Varicella vaccine.** / Consult your health care provider.  Zoster vaccine.** / 1 dose for adults aged 81 years or older.  Pneumococcal 13-valent conjugate (PCV13) vaccine.** / Consult your health care provider.  Pneumococcal polysaccharide (PPSV23) vaccine.** / 1 dose for all adults aged 35 years and older.  Meningococcal vaccine.** / Consult your health care provider.  Hepatitis A vaccine.** / Consult your health care provider.  Hepatitis B vaccine.** / Consult your health care provider.  Haemophilus influenzae type b (Hib) vaccine.** / Consult your health care provider. ** Family history and personal history of risk and conditions may change your health care provider's recommendations.   This information is not intended to replace advice given to you by your health care provider. Make sure you discuss any questions you have with your health care provider.   Document Released: 02/11/2002 Document Revised: 01/06/2015 Document Reviewed: 05/13/2011 Elsevier Interactive Patient Education [employer].

## 2016-06-06 LAB — COMPREHENSIVE METABOLIC PANEL
ALBUMIN: 4.1 g/dL (ref 3.5–5.2)
ALT: 14 U/L (ref 0–35)
AST: 24 U/L (ref 0–37)
Alkaline Phosphatase: 63 U/L (ref 39–117)
BILIRUBIN TOTAL: 0.6 mg/dL (ref 0.2–1.2)
BUN: 13 mg/dL (ref 6–23)
CALCIUM: 9.2 mg/dL (ref 8.4–10.5)
CHLORIDE: 106 meq/L (ref 96–112)
CO2: 28 mEq/L (ref 19–32)
CREATININE: 0.74 mg/dL (ref 0.40–1.20)
GFR: 82.64 mL/min (ref >60.00)
Glucose, Bld: 84 mg/dL (ref 70–99)
Potassium: 4 mEq/L (ref 3.5–5.1)
Sodium: 141 mEq/L (ref 135–145)
Total Protein: 6.7 g/dL (ref 6.0–8.3)

## 2016-06-06 LAB — URINALYSIS, ROUTINE W REFLEX MICROSCOPIC
Bilirubin Urine: NEGATIVE
Ketones, ur: NEGATIVE
Nitrite: NEGATIVE
PH: 6.5 (ref 5.0–8.0)
SPECIFIC GRAVITY, URINE: 1.01 (ref 1.000–1.030)
TOTAL PROTEIN, URINE-UPE24: NEGATIVE
URINE GLUCOSE: NEGATIVE
Urobilinogen, UA: 0.2 (ref 0.0–1.0)

## 2016-06-06 LAB — HEMOGLOBIN A1C: HEMOGLOBIN A1C: 5.9 % (ref 4.6–6.5)

## 2016-06-06 LAB — LIPID PANEL
CHOL/HDL RATIO: 3
Cholesterol: 159 mg/dL (ref 0–200)
HDL: 54 mg/dL (ref >39.00)
LDL CALC: 85 mg/dL (ref 0–99)
NONHDL: 104.54
Triglycerides: 98 mg/dL (ref 0.0–149.0)
VLDL: 19.6 mg/dL (ref 0.0–40.0)

## 2016-06-06 LAB — TSH: TSH: 3.01 u[IU]/mL (ref 0.35–4.50)

## 2016-06-07 ENCOUNTER — Other Ambulatory Visit: Payer: Self-pay | Admitting: Emergency Medicine

## 2016-06-07 DIAGNOSIS — R8290 Unspecified abnormal findings in urine: Secondary | ICD-10-CM

## 2016-06-12 ENCOUNTER — Other Ambulatory Visit (INDEPENDENT_AMBULATORY_CARE_PROVIDER_SITE_OTHER): Payer: Medicare Other

## 2016-06-12 ENCOUNTER — Telehealth: Payer: Self-pay | Admitting: Physician Assistant

## 2016-06-12 DIAGNOSIS — E785 Hyperlipidemia, unspecified: Secondary | ICD-10-CM

## 2016-06-12 DIAGNOSIS — R8290 Unspecified abnormal findings in urine: Secondary | ICD-10-CM | POA: Diagnosis not present

## 2016-06-12 DIAGNOSIS — I10 Essential (primary) hypertension: Secondary | ICD-10-CM

## 2016-06-12 MED ORDER — PRAVASTATIN SODIUM 40 MG PO TABS: 40.0000 mg | ORAL_TABLET | Freq: Every day | ORAL | Status: DC

## 2016-06-12 MED ORDER — EZETIMIBE 10 MG PO TABS: 10.0000 mg | ORAL_TABLET | Freq: Every day | ORAL | Status: DC

## 2016-06-12 MED ORDER — LISINOPRIL-HYDROCHLOROTHIAZIDE 20-12.5 MG PO TABS: 1.0000 | ORAL_TABLET | Freq: Every day | ORAL | Status: DC

## 2016-06-12 NOTE — Telephone Encounter (Signed)
Rx request to pharmacy/SLS  

## 2016-06-12 NOTE — Telephone Encounter (Signed)
Caller name: Shantinique Relation to pt: self Call back number: [phone] Pharmacy: [place], [place]  Reason for call: Pt states is out of her prescription lisinopril-hydrochlorothiazide (PRINZIDE,ZESTORETIC) 20-12.5 MG tablet and is needing it ASAP if this prescription can be sent to her pharmacy [facility] on [address],  pt is also needing refill on Pravastatin 40 mg and Ezetimibe Zetia 10 mg and these two can be sent to [facility] script mail. Please advise.

## 2016-06-13 LAB — URINALYSIS, ROUTINE W REFLEX MICROSCOPIC
BILIRUBIN URINE: NEGATIVE
KETONES UR: NEGATIVE
Nitrite: NEGATIVE
Specific Gravity, Urine: 1.015 (ref 1.000–1.030)
Total Protein, Urine: NEGATIVE
Urine Glucose: NEGATIVE
Urobilinogen, UA: 0.2 (ref 0.0–1.0)
pH: 5.5 (ref 5.0–8.0)

## 2016-06-18 ENCOUNTER — Telehealth: Payer: Self-pay | Admitting: *Deleted

## 2016-06-18 ENCOUNTER — Other Ambulatory Visit (HOSPITAL_COMMUNITY)
Admission: RE | Admit: 2016-06-18 | Discharge: 2016-06-18 | Disposition: A | Discharge status: Home / Self Care | Payer: Medicare Other | Source: Ambulatory Visit | Attending: Physician Assistant | Admitting: Physician Assistant

## 2016-06-18 ENCOUNTER — Other Ambulatory Visit: Payer: Self-pay | Admitting: Physician Assistant

## 2016-06-18 DIAGNOSIS — Z113 Encounter for screening for infections with a predominantly sexual mode of transmission: Secondary | ICD-10-CM | POA: Insufficient documentation

## 2016-06-18 DIAGNOSIS — N760 Acute vaginitis: Secondary | ICD-10-CM | POA: Diagnosis not present

## 2016-06-18 DIAGNOSIS — R8290 Unspecified abnormal findings in urine: Secondary | ICD-10-CM

## 2016-06-18 NOTE — Telephone Encounter (Signed)
Patient informed, understood & agreed to referral; also pt will come in to office and leave urine specimen tomorrow for ancillary testing [reports having some itching], orders have been placed/SLS 06/20  Notes Recorded by Rockwell Germany, CMA on 06/14/2016 at 5:13 PM Cannot add-on after 24 hrs [exactly], will cal pt on Monday to come back in for culture & ancillary/SLS 06.16 Notes Recorded by Brunetta Jeans, PA-C on 06/14/2016 at 1:20 PM Urine with continued red and white blood cells noted. Patient previously asymptomatic. Would like to set her up with Urology for further assessment.

## 2016-06-18 NOTE — Telephone Encounter (Signed)
-----   Message from Kelsey Jeans, PA-C sent at 06/14/2016  1:20 PM EDT ----- Urine with continued red and white blood cells noted. Patient previously asymptomatic. Would like to set her up with Urology for further assessment.   Please add on culture. Can an ancillary be added for BV, yeast and trich?

## 2016-06-20 LAB — URINE CYTOLOGY ANCILLARY ONLY: Trichomonas: NEGATIVE

## 2016-06-24 LAB — URINE CYTOLOGY ANCILLARY ONLY
Bacterial vaginitis: NEGATIVE
Candida vaginitis: NEGATIVE

## 2016-07-05 ENCOUNTER — Encounter: Payer: Self-pay | Admitting: Physician Assistant

## 2016-07-05 ENCOUNTER — Ambulatory Visit (INDEPENDENT_AMBULATORY_CARE_PROVIDER_SITE_OTHER): Payer: Medicare Other | Admitting: Physician Assistant

## 2016-07-05 VITALS — BP 123/62 | HR 60 | Temp 97.5°F | Resp 16 | Ht 67.0 in | Wt 141.5 lb

## 2016-07-05 DIAGNOSIS — K219 Gastro-esophageal reflux disease without esophagitis: Secondary | ICD-10-CM

## 2016-07-05 DIAGNOSIS — R319 Hematuria, unspecified: Secondary | ICD-10-CM | POA: Diagnosis not present

## 2016-07-05 MED ORDER — FLUTICASONE PROPIONATE 50 MCG/ACT NA SUSP: 2.0000 | Freq: Every day | NASAL | Status: DC

## 2016-07-05 NOTE — Assessment & Plan Note (Signed)
Microscopic only. Asymptmatic. Will repeat UA today -- if still present, will need Urology referral

## 2016-07-05 NOTE — Progress Notes (Signed)
Patient presents to clinic today for follow-up of GERD. At last visit patient was started on Protonix 40 mg daily as previous OTC PPI therapy. Endorses taking as directed with overall great improvement in reflux but has noted nausea after taking medication daily. Denies epigastric pain, vomiting or change to stools.   Patient also with recent UA revealing RB in the urine. Ancillary testing and culture negative. Needs repeat UA. Denies urinary or vaginal symptoms. Denies hematuria.  Past Medical History  Diagnosis Date  . Hypertension   . Hyperlipidemia   . GERD (gastroesophageal reflux disease)   . Anxiety   . Anti-D antibodies present   . Blood transfusion without reported diagnosis   . Achalasia   . Osteopenia   . Depression   . Dysplastic colon polyp   . Chicken pox   . Shingles     Current Outpatient Prescriptions on File Prior to Visit  Medication Sig Dispense Refill  . acetaminophen (TYLENOL) 325 MG tablet Take 650 mg by mouth every 6 (six) hours as needed (Knee pain).    Marland Kitchen acetaminophen-codeine (TYLENOL #3) 300-30 MG per tablet Take 1-2 tablets by mouth every 8 (eight) hours as needed for moderate pain. 30 tablet 0  . diclofenac sodium (VOLTAREN) 1 % GEL Apply topically 4 (four) times daily. Left Knee    . escitalopram (LEXAPRO) 20 MG tablet Take 1 tablet (20 mg total) by mouth daily. 90 tablet 1  . ezetimibe (ZETIA) 10 MG tablet Take 1 tablet (10 mg total) by mouth daily. 90 tablet 0  . hydrOXYzine (ATARAX/VISTARIL) 25 MG tablet Take 1 tablet by mouth as needed.     Marland Kitchen lisinopril-hydrochlorothiazide (PRINZIDE,ZESTORETIC) 20-12.5 MG tablet Take 1 tablet by mouth daily. 90 tablet 0  . Multiple Vitamins-Minerals (MULTIVITAMIN & MINERAL PO) Take by mouth.    . nitroGLYCERIN (NITRODUR - DOSED IN MG/24 HR) 0.2 mg/hr patch Apply 1/4th of a patch to left shoulder for rotator cuff tendinopathy  - change daily 30 patch 1  . pantoprazole (PROTONIX) 40 MG tablet Take 1 tablet (40 mg  total) by mouth daily. 30 tablet 3  . pravastatin (PRAVACHOL) 40 MG tablet Take 1 tablet (40 mg total) by mouth daily. 90 tablet 0  . ranitidine (ZANTAC) 150 MG capsule TAKE 1 CAPSULE EVERY EVENING 90 capsule 0   No current facility-administered medications on file prior to visit.    No Known Allergies  Family History  Problem Relation Age of Onset  . Hyperlipidemia Mother     Living  . Hypertension Mother   . Heart attack Neg Hx   . Sudden death Neg Hx   . COPD Father   . Lung cancer Father 30    Deceased  . Hyperlipidemia Sister   . Hypertension Sister   . Throat cancer Brother   . Cancer Brother     throat cancer died age 5  . Diabetes Maternal Grandmother   . Heart disease Paternal Grandmother   . Heart disease Paternal Grandfather   . COPD Sister   . Alcohol abuse Sister     x2  . Liver disease Sister   . Bipolar disorder Daughter   . Healthy Son     x2    Social History   Social History  . Marital Status: Married    Spouse Name: N/A  . Number of Children: N/A  . Years of Education: N/A   Social History Main Topics  . Smoking status: Never Smoker   . Smokeless  tobacco: Never Used  . Alcohol Use: No  . Drug Use: No  . Sexual Activity: Not Asked   Other Topics Concern  . None   Social History Narrative   Marital Status:  Married Jeneen Rinks)    Children:  G3 [id number]   Pets:  None    Living Situation: Lives with spouse, daughter and grandson.     Occupation: Therapist, occupational   Drug Use:  None   Diet:  Regular   Exercise:  Zettie Pho; Walking    Hobbies:  Reading , Bowling, Grandchildren    [Tobacco: Never smoker]    Review of Systems - See HPI.  All other ROS are negative.  BP 123/62 mmHg  Pulse 60  Temp(Src) 97.5 F (36.4 C) (Oral)  Resp 16  Ht 5\' 7"  (1.702 m)  Wt 141 lb 8 oz (64.184 kg)  BMI 22.16 kg/m2  SpO2 100%  Physical Exam  Constitutional: She is oriented to person, place, and time and well-developed,  well-nourished, and in no distress.  HENT:  Head: Normocephalic and atraumatic.  Eyes: Conjunctivae are normal.  Neck: Neck supple.  Cardiovascular: Normal rate, regular rhythm, normal heart sounds and intact distal pulses.   Pulmonary/Chest: Effort normal and breath sounds normal. No respiratory distress. She has no wheezes. She has no rales. She exhibits no tenderness.  Neurological: She is alert and oriented to person, place, and time.  Skin: Skin is warm and dry. No rash noted.  Psychiatric: Affect normal.  Vitals reviewed.  Recent Results (from the past 2160 hour(s))  Comprehensive metabolic panel     Status: None   Collection Time: 06/05/16  2:18 PM  Result Value Ref Range   Sodium 141 135 - 145 mEq/L   Potassium 4.0 3.5 - 5.1 mEq/L   Chloride 106 96 - 112 mEq/L   CO2 28 19 - 32 mEq/L   Glucose, Bld 84 70 - 99 mg/dL   BUN 13 6 - 23 mg/dL   Creatinine, Ser 0.74 0.40 - 1.20 mg/dL   Total Bilirubin 0.6 0.2 - 1.2 mg/dL   Alkaline Phosphatase 63 39 - 117 U/L   AST 24 0 - 37 U/L   ALT 14 0 - 35 U/L   Total Protein 6.7 6.0 - 8.3 g/dL   Albumin 4.1 3.5 - 5.2 g/dL   Calcium 9.2 8.4 - 10.5 mg/dL   GFR 82.64 >60.00 mL/min  Hemoglobin A1c     Status: None   Collection Time: 06/05/16  2:18 PM  Result Value Ref Range   Hgb A1c MFr Bld 5.9 4.6 - 6.5 %    Comment: Glycemic Control Guidelines for People with Diabetes:Non Diabetic:  <6%Goal of Therapy: <7%Additional Action Suggested:  >8%   Lipid panel     Status: None   Collection Time: 06/05/16  2:18 PM  Result Value Ref Range   Cholesterol 159 0 - 200 mg/dL    Comment: ATP III Classification       Desirable:  < 200 mg/dL               Borderline High:  200 - 239 mg/dL          High:  > = 240 mg/dL   Triglycerides 98.0 0.0 - 149.0 mg/dL    Comment: Normal:  <150 mg/dLBorderline High:  150 - 199 mg/dL   HDL 54.00 >39.00 mg/dL   VLDL 19.6 0.0 - 40.0 mg/dL   LDL Cholesterol 85 0 - 99  mg/dL   Total CHOL/HDL Ratio 3     Comment:                 Men          Women1/2 Average Risk     3.4          3.3Average Risk          5.0          4.42X Average Risk          9.6          7.13X Average Risk          15.0          11.0                       NonHDL 104.54     Comment: NOTE:  Non-HDL goal should be 30 mg/dL higher than patient's LDL goal (i.e. LDL goal of < 70 mg/dL, would have non-HDL goal of < 100 mg/dL)  TSH     Status: None   Collection Time: 06/05/16  2:18 PM  Result Value Ref Range   TSH 3.01 0.35 - 4.50 uIU/mL  Urinalysis, Routine w reflex microscopic (not at [facility])     Status: Abnormal   Collection Time: 06/05/16  2:18 PM  Result Value Ref Range   Color, Urine YELLOW Yellow;Lt. Yellow   APPearance CLEAR Clear   Specific Gravity, Urine 1.010 1.000-1.030   pH 6.5 5.0 - 8.0   Total Protein, Urine NEGATIVE Negative   Urine Glucose NEGATIVE Negative   Ketones, ur NEGATIVE Negative   Bilirubin Urine NEGATIVE Negative   Hgb urine dipstick MODERATE (A) Negative   Urobilinogen, UA 0.2 0.0 - 1.0   Leukocytes, UA SMALL (A) Negative   Nitrite NEGATIVE Negative   WBC, UA 3-6/hpf (A) 0-2/hpf   RBC / HPF 3-6/hpf (A) 0-2/hpf   Squamous Epithelial / LPF Rare(0-4/hpf) Rare(0-4/hpf)  Urinalysis, Routine w reflex microscopic (not at [facility])     Status: Abnormal   Collection Time: 06/12/16  3:56 PM  Result Value Ref Range   Color, Urine YELLOW Yellow;Lt. Yellow   APPearance CLEAR Clear   Specific Gravity, Urine 1.015 1.000-1.030   pH 5.5 5.0 - 8.0   Total Protein, Urine NEGATIVE Negative   Urine Glucose NEGATIVE Negative   Ketones, ur NEGATIVE Negative   Bilirubin Urine NEGATIVE Negative   Hgb urine dipstick SMALL (A) Negative   Urobilinogen, UA 0.2 0.0 - 1.0   Leukocytes, UA SMALL (A) Negative   Nitrite NEGATIVE Negative   WBC, UA 3-6/hpf (A) 0-2/hpf   RBC / HPF 3-6/hpf (A) 0-2/hpf   Mucus, UA Presence of (A) None   Squamous Epithelial / LPF Rare(0-4/hpf) Rare(0-4/hpf)   Renal Epithel, UA Rare(0-4/hpf) (A) None    Bacteria, UA Rare(<10/hpf) (A) None  Urine cytology ancillary only     Status: None   Collection Time: 06/18/16 12:00 AM  Result Value Ref Range   Trichomonas Negative     Comment: Normal Reference Range - Negative  Urine cytology ancillary only     Status: None   Collection Time: 06/18/16 12:00 AM  Result Value Ref Range   Bacterial vaginitis Negative for Bacterial Vaginitis Microorganisms     Comment: Normal Reference Range - Negative   Candida vaginitis Negative for Candida Vaginitis Microorganisms     Comment: Normal Reference Range - Negative    Assessment/Plan: GERD (gastroesophageal reflux disease) Will decrease Protonix  to 20 mg daily to see if this helps nausea while still controlling symptoms. GERD diet reviewed and probiotic recommended.  Hematuria Microscopic only. Asymptmatic. Will repeat UA today -- if still present, will need Urology referral     Leeanne Rio, PA-C

## 2016-07-05 NOTE — Progress Notes (Signed)
Pre visit review using our clinic review tool, if applicable. No additional management support is needed unless otherwise documented below in the visit note/SLS  

## 2016-07-05 NOTE — Patient Instructions (Signed)
Please continue Voltaren gel for the knees. Wet the area with a damp cloth first to help the medication absorb better.  Please decrease your Protonix to 1/2 tablet daily.  Let me know if this improves nausea but still controls reflux.  If anything worsens, call or come see me.  Stop by the lab to give a urine sample.

## 2016-07-05 NOTE — Assessment & Plan Note (Signed)
Will decrease Protonix to 20 mg daily to see if this helps nausea while still controlling symptoms. GERD diet reviewed and probiotic recommended.

## 2016-07-06 LAB — URINALYSIS, MICROSCOPIC ONLY
BACTERIA UA: NONE SEEN [HPF]
CRYSTALS: NONE SEEN [HPF]
Casts: NONE SEEN [LPF]
SQUAMOUS EPITHELIAL / LPF: NONE SEEN [HPF] (ref <=5)
WBC, UA: NONE SEEN WBC/HPF (ref <=5)
YEAST: NONE SEEN [HPF]

## 2016-07-06 LAB — URINALYSIS, ROUTINE W REFLEX MICROSCOPIC
BILIRUBIN URINE: NEGATIVE
GLUCOSE, UA: NEGATIVE
Ketones, ur: NEGATIVE
LEUKOCYTES UA: NEGATIVE
Nitrite: NEGATIVE
PH: 6 (ref 5.0–8.0)
PROTEIN: NEGATIVE
SPECIFIC GRAVITY, URINE: 1.016 (ref 1.001–1.035)

## 2016-08-21 ENCOUNTER — Other Ambulatory Visit: Payer: Self-pay | Admitting: Physician Assistant

## 2016-08-21 DIAGNOSIS — F411 Generalized anxiety disorder: Secondary | ICD-10-CM

## 2016-09-01 ENCOUNTER — Other Ambulatory Visit: Payer: Self-pay | Admitting: Physician Assistant

## 2016-09-03 NOTE — Telephone Encounter (Signed)
Rx request to pharmacy/SLS  

## 2016-09-19 ENCOUNTER — Telehealth: Payer: Self-pay | Admitting: Physician Assistant

## 2016-09-19 DIAGNOSIS — I10 Essential (primary) hypertension: Secondary | ICD-10-CM

## 2016-09-19 MED ORDER — LISINOPRIL-HYDROCHLOROTHIAZIDE 20-12.5 MG PO TABS: 1.0000 | ORAL_TABLET | Freq: Every day | ORAL | 1 refills | Status: DC

## 2016-09-19 NOTE — Telephone Encounter (Signed)
Relation to [id number] Call back [phone] Pharmacy: [place], [place] [phone] (Phone) [phone] (Fax)     Reason for call:  Patient requesting a refill lisinopril-hydrochlorothiazide (PRINZIDE,ZESTORETIC) 20-12.5 MG tablet

## 2016-09-19 NOTE — Telephone Encounter (Signed)
Medication has been refilled to her [place]. Please inform patient.

## 2016-09-19 NOTE — Telephone Encounter (Signed)
Patient informed. 

## 2016-10-10 ENCOUNTER — Telehealth: Payer: Self-pay | Admitting: Physician Assistant

## 2016-10-10 DIAGNOSIS — K219 Gastro-esophageal reflux disease without esophagitis: Secondary | ICD-10-CM

## 2016-10-10 DIAGNOSIS — F411 Generalized anxiety disorder: Secondary | ICD-10-CM

## 2016-10-10 DIAGNOSIS — E785 Hyperlipidemia, unspecified: Secondary | ICD-10-CM

## 2016-10-10 MED ORDER — PANTOPRAZOLE SODIUM 40 MG PO TBEC: 40.0000 mg | DELAYED_RELEASE_TABLET | Freq: Every day | ORAL | 1 refills | Status: DC

## 2016-10-10 MED ORDER — EZETIMIBE 10 MG PO TABS: 10.0000 mg | ORAL_TABLET | Freq: Every day | ORAL | 0 refills | Status: DC

## 2016-10-10 MED ORDER — PANTOPRAZOLE SODIUM 40 MG PO TBEC: 40.0000 mg | DELAYED_RELEASE_TABLET | Freq: Every day | ORAL | 3 refills | Status: DC

## 2016-10-10 MED ORDER — ESCITALOPRAM OXALATE 20 MG PO TABS: 20.0000 mg | ORAL_TABLET | Freq: Every day | ORAL | 0 refills | Status: DC

## 2016-10-10 MED ORDER — PRAVASTATIN SODIUM 40 MG PO TABS: 40.0000 mg | ORAL_TABLET | Freq: Every day | ORAL | 0 refills | Status: DC

## 2016-10-10 NOTE — Telephone Encounter (Signed)
Refills have been sent.  

## 2016-10-10 NOTE — Telephone Encounter (Signed)
pantoprazole (PROTONIX) 40 MG tablet  ezetimibe (ZETIA) 10 MG tablet   escitalopram (LEXAPRO) 20 MG tablet  pravastatin (PRAVACHOL) 40 MG tablet  Patient is requesting refills of the above medications. Please advise.  Patient phone: [phone]  Pharmacy:[facility] - [place], [place]

## 2016-10-11 NOTE — Telephone Encounter (Signed)
LVM informing patient refills were sent in.

## 2016-11-20 ENCOUNTER — Ambulatory Visit (INDEPENDENT_AMBULATORY_CARE_PROVIDER_SITE_OTHER): Payer: Medicare Other | Admitting: Behavioral Health

## 2016-11-20 DIAGNOSIS — Z23 Encounter for immunization: Secondary | ICD-10-CM | POA: Diagnosis not present

## 2017-01-07 DIAGNOSIS — H524 Presbyopia: Secondary | ICD-10-CM | POA: Diagnosis not present

## 2017-01-07 DIAGNOSIS — H5203 Hypermetropia, bilateral: Secondary | ICD-10-CM | POA: Diagnosis not present

## 2017-01-07 DIAGNOSIS — H2513 Age-related nuclear cataract, bilateral: Secondary | ICD-10-CM | POA: Diagnosis not present

## 2017-01-23 ENCOUNTER — Other Ambulatory Visit: Payer: Self-pay | Admitting: Physician Assistant

## 2017-01-23 DIAGNOSIS — E785 Hyperlipidemia, unspecified: Secondary | ICD-10-CM

## 2017-01-23 DIAGNOSIS — F411 Generalized anxiety disorder: Secondary | ICD-10-CM

## 2017-02-26 ENCOUNTER — Telehealth: Payer: Self-pay | Admitting: Physician Assistant

## 2017-02-26 NOTE — Telephone Encounter (Signed)
OK with me.

## 2017-02-26 NOTE — Telephone Encounter (Signed)
Patient called requesting a transfer of care from [facility] to Dr. Nani Ravens. Please advise

## 2017-02-26 NOTE — Telephone Encounter (Signed)
Ok with me 

## 2017-03-20 DIAGNOSIS — Z1231 Encounter for screening mammogram for malignant neoplasm of breast: Secondary | ICD-10-CM | POA: Diagnosis not present

## 2017-04-22 ENCOUNTER — Telehealth: Payer: Self-pay | Admitting: Physician Assistant

## 2017-04-22 DIAGNOSIS — I10 Essential (primary) hypertension: Secondary | ICD-10-CM

## 2017-04-22 MED ORDER — LISINOPRIL-HYDROCHLOROTHIAZIDE 20-12.5 MG PO TABS: 1.0000 | ORAL_TABLET | Freq: Every day | ORAL | 0 refills | Status: DC

## 2017-04-22 NOTE — Telephone Encounter (Signed)
Relation to SL:HTDS Call back [phone] Pharmacy: [place], [place]  Reason for call:  Patient requesting lisinopril-hydrochlorothiazide (PRINZIDE,ZESTORETIC) 20-12.5 MG tablet

## 2017-04-22 NOTE — Telephone Encounter (Signed)
°  Relation to TJ:LLVD Call back number:[phone]   Reason for call:  Patient would like to tranfer from [facility] to Dr. Nani Ravens due to [place] far, please advise

## 2017-04-22 NOTE — Telephone Encounter (Signed)
Fine with me

## 2017-04-23 NOTE — Telephone Encounter (Signed)
Patient scheduled for 06/11/2017

## 2017-04-23 NOTE — Telephone Encounter (Signed)
OK w me.  

## 2017-04-23 NOTE — Telephone Encounter (Signed)
Rx sent to the preferred patient pharmacy  

## 2017-06-09 ENCOUNTER — Telehealth: Payer: Self-pay | Admitting: Behavioral Health

## 2017-06-09 ENCOUNTER — Encounter: Payer: Self-pay | Admitting: Behavioral Health

## 2017-06-09 NOTE — Telephone Encounter (Signed)
Pre-Visit Call completed with patient and chart updated.   Pre-Visit Info documented in Specialty Comments under [employer].    

## 2017-06-11 ENCOUNTER — Ambulatory Visit (INDEPENDENT_AMBULATORY_CARE_PROVIDER_SITE_OTHER): Payer: Medicare Other | Admitting: Family Medicine

## 2017-06-11 ENCOUNTER — Encounter: Payer: Self-pay | Admitting: Family Medicine

## 2017-06-11 VITALS — BP 110/78 | HR 71 | Temp 98.1°F | Ht 67.0 in | Wt 141.2 lb

## 2017-06-11 DIAGNOSIS — I10 Essential (primary) hypertension: Secondary | ICD-10-CM | POA: Diagnosis not present

## 2017-06-11 DIAGNOSIS — F411 Generalized anxiety disorder: Secondary | ICD-10-CM | POA: Diagnosis not present

## 2017-06-11 DIAGNOSIS — K219 Gastro-esophageal reflux disease without esophagitis: Secondary | ICD-10-CM | POA: Diagnosis not present

## 2017-06-11 DIAGNOSIS — Z0000 Encounter for general adult medical examination without abnormal findings: Secondary | ICD-10-CM | POA: Diagnosis not present

## 2017-06-11 DIAGNOSIS — E785 Hyperlipidemia, unspecified: Secondary | ICD-10-CM | POA: Diagnosis not present

## 2017-06-11 DIAGNOSIS — R7303 Prediabetes: Secondary | ICD-10-CM | POA: Diagnosis not present

## 2017-06-11 LAB — COMPREHENSIVE METABOLIC PANEL
ALBUMIN: 4.4 g/dL (ref 3.5–5.2)
ALT: 13 U/L (ref 0–35)
AST: 21 U/L (ref 0–37)
Alkaline Phosphatase: 77 U/L (ref 39–117)
BILIRUBIN TOTAL: 0.4 mg/dL (ref 0.2–1.2)
BUN: 11 mg/dL (ref 6–23)
CALCIUM: 10 mg/dL (ref 8.4–10.5)
CO2: 29 meq/L (ref 19–32)
CREATININE: 0.87 mg/dL (ref 0.40–1.20)
Chloride: 102 mEq/L (ref 96–112)
GFR: 68.36 mL/min (ref >60.00)
Glucose, Bld: 93 mg/dL (ref 70–99)
Potassium: 3.4 mEq/L — ABNORMAL LOW (ref 3.5–5.1)
SODIUM: 139 meq/L (ref 135–145)
Total Protein: 7.4 g/dL (ref 6.0–8.3)

## 2017-06-11 LAB — LIPID PANEL
CHOL/HDL RATIO: 6
CHOLESTEROL: 291 mg/dL — AB (ref 0–200)
HDL: 48.7 mg/dL (ref >39.00)
NONHDL: 242.66
Triglycerides: 211 mg/dL — ABNORMAL HIGH (ref 0.0–149.0)
VLDL: 42.2 mg/dL — ABNORMAL HIGH (ref 0.0–40.0)

## 2017-06-11 LAB — LDL CHOLESTEROL, DIRECT: Direct LDL: 190 mg/dL

## 2017-06-11 LAB — HEMOGLOBIN A1C: Hgb A1c MFr Bld: 6.2 % (ref 4.6–6.5)

## 2017-06-11 MED ORDER — RANITIDINE HCL 150 MG PO CAPS: ORAL_CAPSULE | ORAL | 3 refills | Status: DC

## 2017-06-11 MED ORDER — ESCITALOPRAM OXALATE 20 MG PO TABS: 20.0000 mg | ORAL_TABLET | Freq: Every day | ORAL | 3 refills | Status: DC

## 2017-06-11 MED ORDER — EZETIMIBE 10 MG PO TABS: 10.0000 mg | ORAL_TABLET | Freq: Every day | ORAL | 3 refills | Status: DC

## 2017-06-11 MED ORDER — PANTOPRAZOLE SODIUM 40 MG PO TBEC: 40.0000 mg | DELAYED_RELEASE_TABLET | Freq: Every day | ORAL | 3 refills | Status: DC

## 2017-06-11 MED ORDER — PRAVASTATIN SODIUM 40 MG PO TABS: 40.0000 mg | ORAL_TABLET | Freq: Every day | ORAL | 3 refills | Status: DC

## 2017-06-11 MED ORDER — LISINOPRIL-HYDROCHLOROTHIAZIDE 20-12.5 MG PO TABS: 1.0000 | ORAL_TABLET | Freq: Every day | ORAL | 3 refills | Status: DC

## 2017-06-11 NOTE — Patient Instructions (Addendum)
Call insurance company regarding Shingrix coverage. This is the shingles vaccine.  Call Dr Debby Freiberg office about your follow up colonoscopy.  Let us know if you need anything in the meantime.  Give [place] 2-3 business days to get the results of your labs back.   If possible, have your mammogram results sent to [place].

## 2017-06-11 NOTE — Progress Notes (Signed)
Subjective:    Kelsey Beasley is a 71 y.o. female who presents for Medicare Annual/Subsequent preventive examination.  Preventive Screening-Counseling & Management  Tobacco History  Smoking Status  . Never Smoker  Smokeless Tobacco  . Never Used     Problems Prior to Visit 1. Prediabetes 2. Hyperlipidemia 3. Hypertension  Current Problems (verified) Patient Active Problem List   Diagnosis Date Noted  . Hematuria 07/05/2016  . Hyperlipidemia 04/07/2016  . Acute bacterial sinusitis 04/07/2016  . Generalized anxiety disorder 09/18/2015  . Osteopenia 08/12/2014  . GERD (gastroesophageal reflux disease) 11/07/2013  . Essential hypertension, benign 11/07/2013  . Unspecified vitamin D deficiency 11/07/2013    Medications Prior to Visit Current Outpatient Prescriptions on File Prior to Visit  Medication Sig Dispense Refill  . acetaminophen (TYLENOL) 325 MG tablet Take 650 mg by mouth every 6 (six) hours as needed (Knee pain).    Marland Kitchen diclofenac sodium (VOLTAREN) 1 % GEL Apply topically 4 (four) times daily. Left Knee    . escitalopram (LEXAPRO) 20 MG tablet TAKE 1 TABLET DAILY 90 tablet 0  . ezetimibe (ZETIA) 10 MG tablet TAKE 1 TABLET DAILY 90 tablet 0  . fluticasone (FLONASE) 50 MCG/ACT nasal spray Place 2 sprays into both nostrils daily. 48 g 1  . hydrOXYzine (ATARAX/VISTARIL) 25 MG tablet Take 1 tablet by mouth as needed.     Marland Kitchen lisinopril-hydrochlorothiazide (PRINZIDE,ZESTORETIC) 20-12.5 MG tablet Take 1 tablet by mouth daily. 90 tablet 0  . Multiple Vitamins-Minerals (MULTIVITAMIN & MINERAL PO) Take by mouth.    . pantoprazole (PROTONIX) 40 MG tablet Take 1 tablet (40 mg total) by mouth daily. 90 tablet 1  . pravastatin (PRAVACHOL) 40 MG tablet TAKE 1 TABLET DAILY 90 tablet 0  . ranitidine (ZANTAC) 150 MG capsule TAKE 1 CAPSULE EVERY EVENING 90 capsule 1    Current Medications (verified) Current Outpatient Prescriptions  Medication Sig Dispense Refill  . acetaminophen  (TYLENOL) 325 MG tablet Take 650 mg by mouth every 6 (six) hours as needed (Knee pain).    Marland Kitchen diclofenac sodium (VOLTAREN) 1 % GEL Apply topically 4 (four) times daily. Left Knee    . escitalopram (LEXAPRO) 20 MG tablet Take 1 tablet (20 mg total) by mouth daily. 90 tablet 3  . ezetimibe (ZETIA) 10 MG tablet Take 1 tablet (10 mg total) by mouth daily. 90 tablet 3  . fluticasone (FLONASE) 50 MCG/ACT nasal spray Place 2 sprays into both nostrils daily. 48 g 1  . hydrOXYzine (ATARAX/VISTARIL) 25 MG tablet Take 1 tablet by mouth as needed.     Marland Kitchen lisinopril-hydrochlorothiazide (PRINZIDE,ZESTORETIC) 20-12.5 MG tablet Take 1 tablet by mouth daily. 90 tablet 3  . Multiple Vitamins-Minerals (MULTIVITAMIN & MINERAL PO) Take by mouth.    . pantoprazole (PROTONIX) 40 MG tablet Take 1 tablet (40 mg total) by mouth daily. 90 tablet 3  . pravastatin (PRAVACHOL) 40 MG tablet Take 1 tablet (40 mg total) by mouth daily. 90 tablet 3  . ranitidine (ZANTAC) 150 MG capsule TAKE 1 CAPSULE EVERY EVENING 90 capsule 3    Allergies (verified) Patient has no known allergies.   PAST HISTORY  Family History Family History  Problem Relation Age of Onset  . Hyperlipidemia Mother        Living  . Hypertension Mother   . COPD Father   . Lung cancer Father 71       Deceased  . Throat cancer Brother   . Cancer Brother        throat  cancer died age 28  . Diabetes Maternal Grandmother   . Heart disease Paternal Grandmother   . Heart disease Paternal Grandfather   . Hyperlipidemia Sister   . Hypertension Sister   . COPD Sister   . Alcohol abuse Sister        x2  . Liver disease Sister   . Bipolar disorder Daughter   . Healthy Son        x2  . Heart attack Neg Hx   . Sudden death Neg Hx     Social History Social History  Substance Use Topics  . Smoking status: Never Smoker  . Smokeless tobacco: Never Used  . Alcohol use No     Are there smokers in your home (other than you)? No  Risk Factors Current  exercise habits: healthy in general  Dietary issues discussed: No   Cardiac risk factors: advanced age (older than 86 for men, 49 for women), dyslipidemia and hypertension.  Depression Screen (Note: if answer to either of the following is "Yes", a more complete depression screening is indicated)   Over the past two weeks, have you felt down, depressed or hopeless? No  Over the past two weeks, have you felt little interest or pleasure in doing things? No  Have you lost interest or pleasure in daily life? No  Do you often feel hopeless? No  Do you cry easily over simple problems? No  Activities of Daily Living In your present state of health, do you have any difficulty performing the following activities?:  Driving? No Managing money?  No Feeding yourself? No Getting from bed to chair? No Climbing a flight of stairs? No Preparing food and eating?: No Bathing or showering? No Getting dressed: No Getting to the toilet? No Using the toilet:No Moving around from place to place: No In the past year have you fallen or had a near fall?:No  Are you sexually active?  No  Do you have more than one partner?   Hearing Difficulties: No Do you often ask people to speak up or repeat themselves? No Do you experience ringing or noises in your ears? No Do you have difficulty understanding soft or whispered voices? No   Do you feel that you have a problem with memory? No  Do you often misplace items? No  Do you feel safe at home?  Yes  Cognitive Testing  Alert? Yes  Normal Appearance?Yes  Oriented to person? Yes  Place? Yes   Time? Yes  Recall of three objects?  Yes  Can perform simple calculations? Yes  Displays appropriate judgment?Yes  Can read the correct time from a watch face?Yes  Advanced Directives have been discussed with the patient? Yes  List the Names of Other Physician/Practitioners you currently use: 1.  Dr Ivin Booty- gastroenterology  Indicate any recent Medical Services  you may have received from other than [facility] providers in the past year (date may be approximate).  Immunization History  Administered Date(s) Administered  . Influenza, High Dose Seasonal PF 12/21/2015, 11/20/2016  . Influenza,inj,Quad PF,36+ Mos 09/08/2013, 09/12/2014  . Pneumococcal Conjugate-13 04/04/2015  . Pneumococcal-Unspecified 02/03/2013  . Tdap 04/29/2007  . Zoster 01/02/2009    Screening Tests Health Maintenance  Topic Date Due  . Hepatitis C Screening  May 06, 1946  . COLONOSCOPY  04/29/2014  . TETANUS/TDAP  04/28/2017  . INFLUENZA VACCINE  07/30/2017  . MAMMOGRAM  12/21/2017  . DEXA SCAN  Completed  . PNA vac Low Risk Adult  Completed  History reviewed: allergies, current medications, past family history, past medical history, past social history, past surgical history and problem list  Review of Systems Pertinent items are noted in HPI.    Objective:     Vision by Snellen chart: right eye:20/30, left eye:20/30; both eyes 20/25  Body mass index is 22.12 kg/m.   BP 110/78 (BP Location: Left Arm, Patient Position: Sitting, Cuff Size: Normal)   Pulse 71   Temp 98.1 F (36.7 C) (Oral)   Ht 5\' 7"  (1.702 m)   Wt 141 lb 3.2 oz (64 kg)   SpO2 98%   BMI 22.12 kg/m   General Appearance:    Alert, cooperative, no distress, appears stated age  Head:    Normocephalic, without obvious abnormality, atraumatic  Eyes:    PERRL, conjunctiva/corneas clear, EOM's intact, fundi    benign, both eyes  Ears:    Normal TM's and external ear canals, both ears  Nose:   Nares normal, septum midline, mucosa normal, no drainage    or sinus tenderness  Throat:   Lips, mucosa, and tongue normal; teeth and gums normal  Neck:   Supple, symmetrical, trachea midline, no adenopathy;    thyroid:  no enlargement/tenderness/nodules; no carotid   bruit or JVD  Back:     Symmetric, no curvature, ROM normal, no CVA tenderness  Lungs:     Clear to auscultation bilaterally, respirations  unlabored  Chest Wall:    No tenderness or deformity   Heart:    Regular rate and rhythm, S1 and S2 normal, no murmur, rub   or gallop  Breast Exam:    Not done  Abdomen:     Soft, diffuse TTP noted (new to patient also), bowel sounds active all four quadrants,    no masses, no organomegaly  Genitalia:    Not done  Rectal:    Not done  Extremities:   Extremities normal, atraumatic, no cyanosis or edema  Pulses:   2+ and symmetric all extremities  Skin:   Skin color, texture, turgor normal, no rashes or lesions  Lymph nodes:   Cervical, supraclavicular, and axillary nodes normal  Neurologic:   CNII-XII intact, normal strength, sensation and reflexes    throughout       Assessment:     Medicare annual wellness visit, subsequent  Generalized anxiety disorder - Plan: escitalopram (LEXAPRO) 20 MG tablet  Hyperlipidemia, unspecified hyperlipidemia type - Plan: ezetimibe (ZETIA) 10 MG tablet, pravastatin (PRAVACHOL) 40 MG tablet, Comprehensive metabolic panel, Lipid panel  Essential hypertension, benign - Plan: lisinopril-hydrochlorothiazide (PRINZIDE,ZESTORETIC) 20-12.5 MG tablet, Comprehensive metabolic panel  Gastroesophageal reflux disease without esophagitis - Plan: pantoprazole (PROTONIX) 40 MG tablet  Prediabetes - Plan: Hemoglobin A1c      Plan:     During the course of the visit the patient was educated and counseled about appropriate screening and preventive services including:    Td vaccine, follow up on labs, living will/advanced directory information given  Diet review for nutrition referral? Not Indicated    Patient Instructions (the written plan) was given to the patient.  Medicare Attestation I have personally reviewed: The patient's medical and social history Their use of alcohol, tobacco or illicit drugs Their current medications and supplements The patient's functional ability including ADLs,fall risks, home safety risks, cognitive, and hearing and  visual impairment Diet and physical activities Evidence for depression or mood disorders  The patient's weight, height, BMI, and visual acuity have been recorded in the chart.  I have made  referrals, counseling, and provided education to the patient based on review of the above and I have provided the patient with a written personalized care plan for preventive services.     [place], DO   06/11/2017

## 2017-06-13 ENCOUNTER — Telehealth: Payer: Self-pay | Admitting: *Deleted

## 2017-06-13 DIAGNOSIS — E876 Hypokalemia: Secondary | ICD-10-CM

## 2017-06-13 MED ORDER — POTASSIUM CHLORIDE ER 10 MEQ PO TBCR: EXTENDED_RELEASE_TABLET | ORAL | 0 refills | Status: DC

## 2017-06-13 NOTE — Telephone Encounter (Signed)
-----   Message from Shelda Pal, DO sent at 06/11/2017  5:09 PM EDT ----- Hypokalemia. Prediabetes slightly worsened. Will recheck in 3 mo. Her bad cholesterol is very high (LDL). I think we should consider increasing the dose or changing to a high efficiency statin, but will give her some times for lifestyle changes. I want to see her in 3 mo. Please schedule.   AB- let pt know her sugar is a little high, but no diabetes, and her cholesterol is also high. Stay on current medicine and we will f/u in 3 mo w/ me, I would like her to be fasting. Her potassium is slightly low, please call in 10 mEq Klor con twice daily for 7 days, recheck BMP in 7 days dx hypokalemia. TY.

## 2017-06-16 NOTE — Telephone Encounter (Signed)
Called and spoke with the pt on (Friday-06/13/17) and informed her of recent lab results and note.  Pt verbalized understanding and agreed.  Pt was scheduled a future lab appt for (Mon-06/23/17 @ 9:30am).  Future lab ordered and sent.  New prescription for [place] 10 meq-Take 1 tablet by mouth twice daily x 7 days sent to the pharmacy.//AB/CMA

## 2017-06-23 ENCOUNTER — Other Ambulatory Visit (INDEPENDENT_AMBULATORY_CARE_PROVIDER_SITE_OTHER): Payer: Medicare Other

## 2017-06-23 DIAGNOSIS — E876 Hypokalemia: Secondary | ICD-10-CM | POA: Diagnosis not present

## 2017-06-23 LAB — BASIC METABOLIC PANEL
BUN: 15 mg/dL (ref 6–23)
CALCIUM: 9.8 mg/dL (ref 8.4–10.5)
CO2: 26 mEq/L (ref 19–32)
Chloride: 104 mEq/L (ref 96–112)
Creatinine, Ser: 0.83 mg/dL (ref 0.40–1.20)
GFR: 72.17 mL/min (ref >60.00)
GLUCOSE: 110 mg/dL — AB (ref 70–99)
POTASSIUM: 3.8 meq/L (ref 3.5–5.1)
SODIUM: 139 meq/L (ref 135–145)

## 2017-09-15 ENCOUNTER — Encounter: Payer: Self-pay | Admitting: Family Medicine

## 2017-09-15 ENCOUNTER — Ambulatory Visit (INDEPENDENT_AMBULATORY_CARE_PROVIDER_SITE_OTHER): Payer: Medicare Other | Admitting: Family Medicine

## 2017-09-15 VITALS — BP 108/76 | HR 66 | Temp 98.4°F | Ht 67.0 in | Wt 140.0 lb

## 2017-09-15 DIAGNOSIS — Z23 Encounter for immunization: Secondary | ICD-10-CM | POA: Diagnosis not present

## 2017-09-15 DIAGNOSIS — R7303 Prediabetes: Secondary | ICD-10-CM

## 2017-09-15 DIAGNOSIS — E7800 Pure hypercholesterolemia, unspecified: Secondary | ICD-10-CM | POA: Diagnosis not present

## 2017-09-15 DIAGNOSIS — M533 Sacrococcygeal disorders, not elsewhere classified: Secondary | ICD-10-CM

## 2017-09-15 DIAGNOSIS — I10 Essential (primary) hypertension: Secondary | ICD-10-CM

## 2017-09-15 HISTORY — DX: Prediabetes: R73.03

## 2017-09-15 LAB — LIPID PANEL
Cholesterol: 200 mg/dL (ref 0–200)
HDL: 62.2 mg/dL (ref >39.00)
LDL Cholesterol: 105 mg/dL — ABNORMAL HIGH (ref 0–99)
NonHDL: 137.5
TRIGLYCERIDES: 164 mg/dL — AB (ref 0.0–149.0)
Total CHOL/HDL Ratio: 3
VLDL: 32.8 mg/dL (ref 0.0–40.0)

## 2017-09-15 LAB — HEMOGLOBIN A1C: Hgb A1c MFr Bld: 6 % (ref 4.6–6.5)

## 2017-09-15 NOTE — Progress Notes (Signed)
Pre visit review using our clinic review tool, if applicable. No additional management support is needed unless otherwise documented below in the visit note. 

## 2017-09-15 NOTE — Progress Notes (Signed)
Chief Complaint  Patient presents with  . Follow-up    Subjective: Dyslipidemia Patient presents for dyslipidemia follow up. Compliance with treatment thus far has been good- pravastatin 40 mg daily. She had not been taking it for the past 1.5 weeks She confirms some myalgias. She is adhering to a low sodium and low fat diet- gets fruits and veggies. The patient exercises 1-2 times per week walking; active around the house.  The patient is not known to have coexisting coronary artery disease.    Hypertension Patient presents for hypertension follow up. She does monitor home blood pressures. Blood pressures ranging on average from 120's/60-70's. She is compliant with medications- Prinzide 20-25 mg daily. Patient has these side effects of medication: none She is adhering to a healthy diet overall.  Prediabetes Reports she has been eating healthy, has never been told she has a high sugar level. Physically active with yard/housework, will walk sometimes. +mgma with DM, no one else in family. No increased urination or thirst.  Tail bone Labor Day, she felt backwards while trying to take out trash. She fell on R side of tail bone. Steadily getting better, still painful. Worse when squatting down and rising up. No bruising, numbness, tingling, weakness. She has been using Tylenol and ice. Tried heat with minimal relief.    ROS: Heart: Denies chest pain or palpitations Lungs: Denies SOB or cough  Family History  Problem Relation Age of Onset  . Hyperlipidemia Mother        Living  . Hypertension Mother   . COPD Father   . Lung cancer Father 74       Deceased  . Throat cancer Brother   . Cancer Brother        throat cancer died age 31  . Diabetes Maternal Grandmother   . Heart disease Paternal Grandmother   . Heart disease Paternal Grandfather   . Hyperlipidemia Sister   . Hypertension Sister   . COPD Sister   . Alcohol abuse Sister        x2  . Liver disease Sister   .  Bipolar disorder Daughter   . Healthy Son        x2  . Heart attack Neg Hx   . Sudden death Neg Hx    Past Medical History:  Diagnosis Date  . Achalasia   . Anti-D antibodies present   . Anxiety   . Blood transfusion without reported diagnosis   . Chicken pox   . Depression   . Dysplastic colon polyp   . GERD (gastroesophageal reflux disease)   . Hyperlipidemia   . Hypertension   . Osteopenia   . Prediabetes 09/15/2017  . Shingles    No Known Allergies  Current Outpatient Prescriptions:  .  acetaminophen (TYLENOL) 325 MG tablet, Take 650 mg by mouth every 6 (six) hours as needed (Knee pain)., Disp: , Rfl:  .  diclofenac sodium (VOLTAREN) 1 % GEL, Apply topically 4 (four) times daily. Left Knee, Disp: , Rfl:  .  escitalopram (LEXAPRO) 20 MG tablet, Take 1 tablet (20 mg total) by mouth daily., Disp: 90 tablet, Rfl: 3 .  ezetimibe (ZETIA) 10 MG tablet, Take 1 tablet (10 mg total) by mouth daily., Disp: 90 tablet, Rfl: 3 .  fluticasone (FLONASE) 50 MCG/ACT nasal spray, Place 2 sprays into both nostrils daily., Disp: 48 g, Rfl: 1 .  hydrOXYzine (ATARAX/VISTARIL) 25 MG tablet, Take 1 tablet by mouth as needed. , Disp: , Rfl:  .  lisinopril-hydrochlorothiazide (PRINZIDE,ZESTORETIC) 20-12.5 MG tablet, Take 1 tablet by mouth daily., Disp: 90 tablet, Rfl: 3 .  Multiple Vitamins-Minerals (MULTIVITAMIN & MINERAL PO), Take by mouth., Disp: , Rfl:  .  pantoprazole (PROTONIX) 40 MG tablet, Take 1 tablet (40 mg total) by mouth daily., Disp: 90 tablet, Rfl: 3 .  potassium chloride (K-DUR) 10 MEQ tablet, Take 1 tablet by mouth twice daily x 7 days., Disp: 14 tablet, Rfl: 0 .  pravastatin (PRAVACHOL) 40 MG tablet, Take 1 tablet (40 mg total) by mouth daily., Disp: 90 tablet, Rfl: 3 .  ranitidine (ZANTAC) 150 MG capsule, TAKE 1 CAPSULE EVERY EVENING, Disp: 90 capsule, Rfl: 3  Objective: BP 108/76 (BP Location: Left Arm, Patient Position: Sitting, Cuff Size: Normal)   Pulse 66   Temp 98.4 F (36.9  C) (Oral)   Ht 5\' 7"  (1.702 m)   Wt 140 lb (63.5 kg)   SpO2 96%   BMI 21.93 kg/m  General: Awake, appears stated age HEENT: MMM, EOMi Heart: RRR, no LE edema, no bruits Lungs: CTAB, no rales, wheezes or rhonchi. No accessory muscle use MSK: +TTP over R sacrum, no deformity, slight antalgic gait Psych: Age appropriate judgment and insight, normal affect and mood  Assessment and Plan: Pure hypercholesterolemia - Plan: Lipid panel  Prediabetes - Plan: Hemoglobin A1c  Essential hypertension, benign  Tail bone pain  Recheck labs. Cont pravastatin and Zetia for now, may need to change to atorvastatin depending on results. Counseled on diet and exercise. Cont Prinzide. Heat/ice/Tylenol, she is getting better. F/u in 3 mo pending above. The patient voiced understanding and agreement to the plan.  [place], DO 09/15/17  9:24 AM

## 2017-09-15 NOTE — Addendum Note (Signed)
Addended by: Sharon Seller B on: 09/15/2017 10:04 AM   Modules accepted: Orders

## 2017-09-15 NOTE — Patient Instructions (Addendum)
OK to take Tylenol 1000 mg (2 extra strength tabs) or 975 mg (3 regular strength tabs) every 6 hours as needed.  Ice/cold pack over area for 10-15 min every 2-3 hours while awake.  Heat (pad or rice pillow in microwave) over affected area, 10-15 minutes every 2-3 hours while awake.   Try to avoid aggravating activities if possible.  Give [place] 2-3 business days to get the results of your labs back. If labs are normal, you will likely receive a letter in the mail unless you have MyChart. This can take longer than 2-3 business days.   Let us know if you need anything.

## 2017-12-11 ENCOUNTER — Ambulatory Visit (INDEPENDENT_AMBULATORY_CARE_PROVIDER_SITE_OTHER): Payer: Medicare Other | Admitting: Family Medicine

## 2017-12-11 ENCOUNTER — Encounter: Payer: Self-pay | Admitting: Family Medicine

## 2017-12-11 VITALS — BP 108/80 | HR 92 | Temp 98.3°F | Ht 61.0 in | Wt 136.5 lb

## 2017-12-11 DIAGNOSIS — I10 Essential (primary) hypertension: Secondary | ICD-10-CM | POA: Diagnosis not present

## 2017-12-11 DIAGNOSIS — F418 Other specified anxiety disorders: Secondary | ICD-10-CM | POA: Diagnosis not present

## 2017-12-11 DIAGNOSIS — E7800 Pure hypercholesterolemia, unspecified: Secondary | ICD-10-CM | POA: Diagnosis not present

## 2017-12-11 MED ORDER — BUSPIRONE HCL 10 MG PO TABS: 10.0000 mg | ORAL_TABLET | Freq: Two times a day (BID) | ORAL | 2 refills | Status: DC

## 2017-12-11 MED ORDER — HYDROXYZINE HCL 25 MG PO TABS: 25.0000 mg | ORAL_TABLET | Freq: Three times a day (TID) | ORAL | 2 refills | Status: DC | PRN

## 2017-12-11 NOTE — Patient Instructions (Addendum)
Please consider counseling. Contact [phone] to schedule an appointment or inquire about cost/insurance coverage.  Coping skills Choose 5 that work for you:  Take a deep breath  Count to 20  Read a book  Do a puzzle  Meditate  Bake  Sing  Knit  Garden  Pray  Go outside  Call a friend  Listen to music  Take a walk  Color  Send a note  Take a bath  Watch a movie  Be alone in a quiet place  Pet an animal  Visit a friend  Journal  Exercise  Stretch   Let us know if you need anything.  

## 2017-12-11 NOTE — Progress Notes (Signed)
Pre visit review using our clinic review tool, if applicable. No additional management support is needed unless otherwise documented below in the visit note. 

## 2017-12-11 NOTE — Progress Notes (Signed)
Chief Complaint  Patient presents with  . Follow-up    Subjective: Patient is a 71 y.o. female here for f/u. Cholesterol has been good since going back on pravastatin.  She is tolerating the medicine well and reports compliance.  She does not have any myalgias.  She reports her diet is healthy overall.  She is getting some walking in.  She does not routinely check her blood pressure.  She is tolerating her Prinzide well.  Around 4 months ago, her husband was diagnosed with metastatic esophageal carcinoma.  He is undergoing chemotherapy right now.  Due to this, she is been having episodes of weeping and panic.  She is interested in starting something to help with this.  She is using her friends and pastor as a social support.  She is not following with a counselor or psychologist.  She is on Lexapro for generalized anxiety which she continues to take.  No thoughts of harming herself or others and she is not self medicating.   ROS: Heart: Denies palpitations Psych: No SI or HI  Family History  Problem Relation Age of Onset  . Hyperlipidemia Mother        Living  . Hypertension Mother   . COPD Father   . Lung cancer Father 38       Deceased  . Throat cancer Brother   . Cancer Brother        throat cancer died age 10  . Diabetes Maternal Grandmother   . Heart disease Paternal Grandmother   . Heart disease Paternal Grandfather   . Hyperlipidemia Sister   . Hypertension Sister   . COPD Sister   . Alcohol abuse Sister        x2  . Liver disease Sister   . Bipolar disorder Daughter   . Healthy Son        x2  . Heart attack Neg Hx   . Sudden death Neg Hx    Past Medical History:  Diagnosis Date  . Achalasia   . Anti-D antibodies present   . Anxiety   . Blood transfusion without reported diagnosis   . Chicken pox   . Depression   . Dysplastic colon polyp   . GERD (gastroesophageal reflux disease)   . Hyperlipidemia   . Hypertension   . Osteopenia   . Prediabetes 09/15/2017   . Shingles    No Known Allergies  Current Outpatient Medications:  .  acetaminophen (TYLENOL) 325 MG tablet, Take 650 mg by mouth every 6 (six) hours as needed (Knee pain)., Disp: , Rfl:  .  diclofenac sodium (VOLTAREN) 1 % GEL, Apply topically 4 (four) times daily. Left Knee, Disp: , Rfl:  .  escitalopram (LEXAPRO) 20 MG tablet, Take 1 tablet (20 mg total) by mouth daily., Disp: 90 tablet, Rfl: 3 .  ezetimibe (ZETIA) 10 MG tablet, Take 1 tablet (10 mg total) by mouth daily., Disp: 90 tablet, Rfl: 3 .  fluticasone (FLONASE) 50 MCG/ACT nasal spray, Place 2 sprays into both nostrils daily., Disp: 48 g, Rfl: 1 .  hydrOXYzine (ATARAX/VISTARIL) 25 MG tablet, Take 1 tablet (25 mg total) by mouth every 8 (eight) hours as needed for anxiety., Disp: 60 tablet, Rfl: 2 .  lisinopril-hydrochlorothiazide (PRINZIDE,ZESTORETIC) 20-12.5 MG tablet, Take 1 tablet by mouth daily., Disp: 90 tablet, Rfl: 3 .  Multiple Vitamins-Minerals (MULTIVITAMIN & MINERAL PO), Take by mouth., Disp: , Rfl:  .  pantoprazole (PROTONIX) 40 MG tablet, Take 1 tablet (40 mg total) by mouth  daily., Disp: 90 tablet, Rfl: 3 .  potassium chloride (K-DUR) 10 MEQ tablet, Take 1 tablet by mouth twice daily x 7 days., Disp: 14 tablet, Rfl: 0 .  pravastatin (PRAVACHOL) 40 MG tablet, Take 1 tablet (40 mg total) by mouth daily., Disp: 90 tablet, Rfl: 3 .  ranitidine (ZANTAC) 150 MG capsule, TAKE 1 CAPSULE EVERY EVENING, Disp: 90 capsule, Rfl: 3 .  busPIRone (BUSPAR) 10 MG tablet, Take 1 tablet (10 mg total) by mouth 2 (two) times daily., Disp: 60 tablet, Rfl: 2  Objective: BP 108/80 (BP Location: Right Leg, Patient Position: Sitting, Cuff Size: Normal)   Pulse 92   Temp 98.3 F (36.8 C) (Oral)   Ht 5\' 1"  (1.549 m)   Wt 136 lb 8 oz (61.9 kg)   SpO2 98%   BMI 25.79 kg/m  General: Awake, appears stated age HEENT: MMM, EOMi Heart: RRR, no bruits or LE edema Lungs: CTAB, no rales, wheezes or rhonchi. No accessory muscle use Psych: Age  appropriate judgment and insight, normal affect and mood, she did become tearful when talking about her husband's diagnosis.  Assessment and Plan: Situational anxiety - Plan: busPIRone (BUSPAR) 10 MG tablet, hydrOXYzine (ATARAX/VISTARIL) 25 MG tablet  Essential hypertension, benign  Pure hypercholesterolemia  Orders as above.  Start buspirone twice daily.  Hydroxyzine as needed.  Number for counseling also given.  It sounds like she has a good support network.  She specifically stated that she did not want anything that she would become addicted to.  Thus, we will try the above medicine first.  Low-dose Klonopin may be beneficial if this is not helpful. She is doing well with her blood pressure and cholesterol. Follow-up in 4 weeks to recheck. The patient voiced understanding and agreement to the plan.  [place], DO 12/11/17  11:35 AM

## 2018-01-12 ENCOUNTER — Ambulatory Visit: Payer: Self-pay | Admitting: Family Medicine

## 2018-01-14 DIAGNOSIS — H524 Presbyopia: Secondary | ICD-10-CM | POA: Diagnosis not present

## 2018-01-14 DIAGNOSIS — H2513 Age-related nuclear cataract, bilateral: Secondary | ICD-10-CM | POA: Diagnosis not present

## 2018-03-25 ENCOUNTER — Ambulatory Visit (INDEPENDENT_AMBULATORY_CARE_PROVIDER_SITE_OTHER): Payer: Medicare Other | Admitting: Family Medicine

## 2018-03-25 ENCOUNTER — Encounter: Payer: Self-pay | Admitting: Family Medicine

## 2018-03-25 VITALS — BP 118/78 | HR 62 | Temp 98.1°F | Ht 61.0 in | Wt 138.2 lb

## 2018-03-25 DIAGNOSIS — F418 Other specified anxiety disorders: Secondary | ICD-10-CM | POA: Diagnosis not present

## 2018-03-25 NOTE — Progress Notes (Signed)
Chief Complaint  Patient presents with  . Follow-up    medications    Subjective Kelsey Beasley presents for f/u anxiety.  She was being tx'd w Lexapro, BuSpar and Hydroxyzine were added. She is tolerating well and the medicine is working. She does not wish to change dose. Orthostasis has worsened though in absence of illness or change in PO intake.   ROS Psych: No homicidal or suicidal thoughts  Past Medical History:  Diagnosis Date  . Achalasia   . Anti-D antibodies present   . Anxiety   . Blood transfusion without reported diagnosis   . Chicken pox   . Depression   . Dysplastic colon polyp   . GERD (gastroesophageal reflux disease)   . Hyperlipidemia   . Hypertension   . Osteopenia   . Prediabetes 09/15/2017  . Shingles    Exam BP 118/78 (BP Location: Left Arm, Patient Position: Sitting, Cuff Size: Normal)   Pulse 62   Temp 98.1 F (36.7 C) (Oral)   Ht 5\' 1"  (1.549 m)   Wt 138 lb 4 oz (62.7 kg)   SpO2 94%   BMI 26.12 kg/m  General:  well developed, well nourished, in no apparent distress Neck: neck supple without adenopathy, thyromegaly, or masses Lungs:  CTAB Cardio:  RRR Psych: well oriented with normal range of affect and age-appropriate judgement/insight, alert and oriented x4.  Assessment and Plan  Situational anxiety  Cont meds.  Stay hydrated, ck bp in AM. Get up slowly. F/u in 6 mo or prn. The patient voiced understanding and agreement to the plan.  [place], DO 03/25/18 1:59 PM

## 2018-03-25 NOTE — Progress Notes (Signed)
Pre visit review using our clinic review tool, if applicable. No additional management support is needed unless otherwise documented below in the visit note. 

## 2018-03-25 NOTE — Patient Instructions (Addendum)
Stay on current medication.   Get up slowly and wait a moment before starting to move.    Stay well hydrated and eat regular meals.  Check your blood pressure in the morning and let us know if it is low.   Let us know if you need anything.

## 2018-04-15 ENCOUNTER — Other Ambulatory Visit: Payer: Self-pay | Admitting: Family Medicine

## 2018-04-15 DIAGNOSIS — F418 Other specified anxiety disorders: Secondary | ICD-10-CM

## 2018-04-15 MED ORDER — BUSPIRONE HCL 10 MG PO TABS: 10.0000 mg | ORAL_TABLET | Freq: Two times a day (BID) | ORAL | 0 refills | Status: DC

## 2018-06-12 ENCOUNTER — Other Ambulatory Visit: Payer: Self-pay | Admitting: Family Medicine

## 2018-06-12 DIAGNOSIS — E785 Hyperlipidemia, unspecified: Secondary | ICD-10-CM

## 2018-06-12 DIAGNOSIS — F411 Generalized anxiety disorder: Secondary | ICD-10-CM

## 2018-06-12 NOTE — Telephone Encounter (Signed)
Lexapro, Pravachol, Zetia refills Last OV:12/11/17 Last refill:06/11/17 90 tab/3 refill (expired prescriptions) CVE:[id number] Pharmacy: [facility], [place] [phone] (Phone) [phone] (Fax)

## 2018-06-12 NOTE — Telephone Encounter (Signed)
Copied from [place] [phone]. Topic: Quick Communication - See Telephone Encounter >> Jun 12, 2018  2:55 PM Conception Chancy, NT wrote: CRM for notification. See Telephone encounter for: 06/12/18.  Patient is requesting a refill on the following medications.   escitalopram (LEXAPRO) 20 MG tablet  pravastatin (PRAVACHOL) 40 MG tablet  ezetimibe (ZETIA) 10 MG tablet   [facility] - [place], [place] - [address] [place] [address] Phone: [phone] Fax: [phone]

## 2018-06-15 MED ORDER — ESCITALOPRAM OXALATE 20 MG PO TABS: 20.0000 mg | ORAL_TABLET | Freq: Every day | ORAL | 3 refills | Status: DC

## 2018-06-15 MED ORDER — PRAVASTATIN SODIUM 40 MG PO TABS: 40.0000 mg | ORAL_TABLET | Freq: Every day | ORAL | 3 refills | Status: DC

## 2018-06-15 MED ORDER — EZETIMIBE 10 MG PO TABS: 10.0000 mg | ORAL_TABLET | Freq: Every day | ORAL | 3 refills | Status: DC

## 2018-06-27 ENCOUNTER — Other Ambulatory Visit: Payer: Self-pay | Admitting: Family Medicine

## 2018-06-27 DIAGNOSIS — F418 Other specified anxiety disorders: Secondary | ICD-10-CM

## 2018-07-10 ENCOUNTER — Other Ambulatory Visit: Payer: Self-pay | Admitting: Family Medicine

## 2018-07-10 DIAGNOSIS — K219 Gastro-esophageal reflux disease without esophagitis: Secondary | ICD-10-CM

## 2018-07-10 DIAGNOSIS — I10 Essential (primary) hypertension: Secondary | ICD-10-CM

## 2018-07-15 ENCOUNTER — Other Ambulatory Visit: Payer: Self-pay | Admitting: Family Medicine

## 2018-07-15 DIAGNOSIS — F418 Other specified anxiety disorders: Secondary | ICD-10-CM

## 2018-07-15 MED ORDER — HYDROXYZINE HCL 25 MG PO TABS
25.0000 mg | ORAL_TABLET | Freq: Three times a day (TID) | ORAL | 0 refills | Status: DC | PRN
Start: 2018-07-15 — End: 2018-09-28

## 2018-07-31 ENCOUNTER — Telehealth: Payer: Self-pay | Admitting: Family Medicine

## 2018-07-31 DIAGNOSIS — I10 Essential (primary) hypertension: Secondary | ICD-10-CM

## 2018-07-31 MED ORDER — LISINOPRIL-HYDROCHLOROTHIAZIDE 20-12.5 MG PO TABS: 1.0000 | ORAL_TABLET | Freq: Every day | ORAL | 3 refills | Status: DC

## 2018-07-31 NOTE — Telephone Encounter (Signed)
Copied from [place] [id number]. Topic: Quick Communication - See Telephone Encounter >> Jul 31, 2018  3:02 PM Hewitt Shorts wrote: [facility] patient stating that they can no longer fill her lisinopril and she is needing to know what she needs to do next   Best number [phone]  Spoke to the patient and faxed to her local pharmacy

## 2018-09-25 ENCOUNTER — Ambulatory Visit (HOSPITAL_BASED_OUTPATIENT_CLINIC_OR_DEPARTMENT_OTHER)
Admission: RE | Admit: 2018-09-25 | Discharge: 2018-09-25 | Disposition: A | Discharge status: Home / Self Care | Payer: Medicare Other | Source: Ambulatory Visit | Attending: Family Medicine | Admitting: Family Medicine

## 2018-09-25 ENCOUNTER — Ambulatory Visit (INDEPENDENT_AMBULATORY_CARE_PROVIDER_SITE_OTHER): Payer: Medicare Other | Admitting: Family Medicine

## 2018-09-25 ENCOUNTER — Encounter: Payer: Self-pay | Admitting: Family Medicine

## 2018-09-25 VITALS — BP 112/70 | HR 67 | Temp 98.3°F | Resp 16 | Ht 61.0 in | Wt 138.6 lb

## 2018-09-25 DIAGNOSIS — E7800 Pure hypercholesterolemia, unspecified: Secondary | ICD-10-CM | POA: Diagnosis not present

## 2018-09-25 DIAGNOSIS — M1612 Unilateral primary osteoarthritis, left hip: Secondary | ICD-10-CM | POA: Diagnosis not present

## 2018-09-25 DIAGNOSIS — M160 Bilateral primary osteoarthritis of hip: Secondary | ICD-10-CM | POA: Diagnosis not present

## 2018-09-25 DIAGNOSIS — F418 Other specified anxiety disorders: Secondary | ICD-10-CM

## 2018-09-25 DIAGNOSIS — R1032 Left lower quadrant pain: Secondary | ICD-10-CM | POA: Diagnosis not present

## 2018-09-25 DIAGNOSIS — R7303 Prediabetes: Secondary | ICD-10-CM | POA: Diagnosis not present

## 2018-09-25 DIAGNOSIS — Z23 Encounter for immunization: Secondary | ICD-10-CM

## 2018-09-25 DIAGNOSIS — I10 Essential (primary) hypertension: Secondary | ICD-10-CM

## 2018-09-25 LAB — COMPREHENSIVE METABOLIC PANEL
ALT: 11 U/L (ref 0–35)
AST: 18 U/L (ref 0–37)
Albumin: 4.3 g/dL (ref 3.5–5.2)
Alkaline Phosphatase: 64 U/L (ref 39–117)
BUN: 12 mg/dL (ref 6–23)
CHLORIDE: 103 meq/L (ref 96–112)
CO2: 32 mEq/L (ref 19–32)
CREATININE: 0.86 mg/dL (ref 0.40–1.20)
Calcium: 9.9 mg/dL (ref 8.4–10.5)
GFR: 69.03 mL/min (ref >60.00)
GLUCOSE: 114 mg/dL — AB (ref 70–99)
POTASSIUM: 4.3 meq/L (ref 3.5–5.1)
SODIUM: 141 meq/L (ref 135–145)
Total Bilirubin: 0.5 mg/dL (ref 0.2–1.2)
Total Protein: 6.9 g/dL (ref 6.0–8.3)

## 2018-09-25 LAB — LIPID PANEL
CHOL/HDL RATIO: 4
Cholesterol: 196 mg/dL (ref 0–200)
HDL: 51.1 mg/dL (ref >39.00)
NONHDL: 144.72
Triglycerides: 201 mg/dL — ABNORMAL HIGH (ref 0.0–149.0)
VLDL: 40.2 mg/dL — AB (ref 0.0–40.0)

## 2018-09-25 LAB — LDL CHOLESTEROL, DIRECT: LDL DIRECT: 117 mg/dL

## 2018-09-25 LAB — HEMOGLOBIN A1C: HEMOGLOBIN A1C: 5.9 % (ref 4.6–6.5)

## 2018-09-25 MED ORDER — BUSPIRONE HCL 10 MG PO TABS: 10.0000 mg | ORAL_TABLET | Freq: Two times a day (BID) | ORAL | 3 refills | Status: DC

## 2018-09-25 NOTE — Progress Notes (Signed)
Chief Complaint  Patient presents with  . Anxiety    6 month follow up  . Flu Vaccine    Subjective Kelsey Beasley presents for f/u anxiety/depression.  She is currently being treated with Lexapro, Buspar, Hydroxyzine.  Reports good improvement since treatment. No thoughts of harming self or others. No self-medication with alcohol, prescription drugs or illicit drugs. Pt is not following with a counselor/psychologist.  Hypertension Patient presents for hypertension follow up. She does monitor home blood pressures. Blood pressures ranging on average from 110's/70's. She is compliant with medications. Patient has these side effects of medication: none She is adhering to a healthy diet overall. Exercise: some walking  Hyperlipidemia Patient presents for dyslipidemia follow up. Currently being treated with pravastatin and compliance with treatment thus far has been good. She denies myalgias. She is adhering to a healthy. The patient is not known to have coexisting coronary artery disease.  Hx of inguinal pain. Hurts with certain movements. No inj or change in activity. +famhx of OA in hips and knees in mother.   ROS Psych: No homicidal or suicidal thoughts MSK: As noted in HPI  Past Medical History:  Diagnosis Date  . Achalasia   . Anti-D antibodies present   . Anxiety   . Blood transfusion without reported diagnosis   . Chicken pox   . Depression   . Dysplastic colon polyp   . GERD (gastroesophageal reflux disease)   . Hyperlipidemia   . Hypertension   . Osteopenia   . Prediabetes 09/15/2017  . Shingles    Allergies as of 09/25/2018   No Known Allergies     Medication List        Accurate as of 09/25/18  1:47 PM. Always use your most recent med list.          acetaminophen 325 MG tablet Commonly known as:  TYLENOL Take 650 mg by mouth every 6 (six) hours as needed (Knee pain).   busPIRone 10 MG tablet Commonly known as:  BUSPAR Take 1 tablet (10 mg  total) by mouth 2 (two) times daily.   diclofenac sodium 1 % Gel Commonly known as:  VOLTAREN Apply topically 4 (four) times daily. Left Knee   escitalopram 20 MG tablet Commonly known as:  LEXAPRO Take 1 tablet (20 mg total) by mouth daily.   ezetimibe 10 MG tablet Commonly known as:  ZETIA Take 1 tablet (10 mg total) by mouth daily.   fluticasone 50 MCG/ACT nasal spray Commonly known as:  FLONASE Place 2 sprays into both nostrils daily.   hydrOXYzine 25 MG tablet Commonly known as:  ATARAX/VISTARIL Take 1 tablet (25 mg total) by mouth every 8 (eight) hours as needed for anxiety.   lisinopril-hydrochlorothiazide 20-12.5 MG tablet Commonly known as:  PRINZIDE,ZESTORETIC Take 1 tablet by mouth daily.   MULTIVITAMIN & MINERAL PO Take by mouth.   pantoprazole 40 MG tablet Commonly known as:  PROTONIX TAKE 1 TABLET DAILY   potassium chloride 10 MEQ tablet Commonly known as:  K-DUR Take 1 tablet by mouth twice daily x 7 days.   pravastatin 40 MG tablet Commonly known as:  PRAVACHOL Take 1 tablet (40 mg total) by mouth daily.   ranitidine 150 MG capsule Commonly known as:  ZANTAC TAKE 1 CAPSULE EVERY EVENING       Exam BP 112/70 (BP Location: Right Arm, Patient Position: Sitting, Cuff Size: Normal)   Pulse 67   Temp 98.3 F (36.8 C) (Oral)   Resp 16  Ht 5\' 1"  (1.549 m)   Wt 138 lb 9.6 oz (62.9 kg)   SpO2 97%   BMI 26.19 kg/m  General:  well developed, well nourished, in no apparent distress Neck: neck supple without adenopathy, thyromegaly, or masses Lungs:  clear to auscultation, breath sounds equal bilaterally, no respiratory distress Cardio:  regular rate and rhythm without murmurs, heart sounds without clicks or rubs MSK: +TTP over lumbar parasp msk b/l Psych: well oriented with normal range of affect and age-appropriate judgement/insight, alert and oriented x4.  Assessment and Plan  Situational anxiety - Plan: busPIRone (BUSPAR) 10 MG tablet  Need  for influenza vaccination - Plan: Flu vaccine HIGH DOSE PF (Fluzone High dose)  Prediabetes - Plan: Hemoglobin A1c  Left inguinal pain - Plan: DG HIP UNILAT WITH PELVIS 2-3 VIEWS LEFT  Pure hypercholesterolemia - Plan: Lipid panel  Essential hypertension, benign - Plan: Comprehensive metabolic panel  Orders as above. #1 doing well on current meds, husband recently passed away, handling well so far as he was in hospice and she had good experience. Ck A1c #4 concerned about hip OA; stretches/exercises were given for back. #5 stay on meds, ck lipid panel #6 OK to stop checking home BP's, counseled on diet and exercise.  F/u in 6 mo for AWV and med check. The patient voiced understanding and agreement to the plan.  [place], DO 09/25/18 1:47 PM

## 2018-09-25 NOTE — Patient Instructions (Addendum)
Because your blood pressure is well-controlled, you no longer have to check your blood pressure at home anymore unless you wish. Some people check it twice daily every day and some people stop altogether. Either or anything in between is fine. Strong work!  Heat (pad or rice pillow in microwave) over affected area, 10-15 minutes twice daily.   Give [place] 2-3 business days to get the results of your labs back.   Let us know if you need anything.  EXERCISES  RANGE OF MOTION (ROM) AND STRETCHING EXERCISES - Low Back Pain Most people with lower back pain will find that their symptoms get worse with excessive bending forward (flexion) or arching at the lower back (extension). The exercises that will help resolve your symptoms will focus on the opposite motion.  If you have pain, numbness or tingling which travels down into your buttocks, leg or foot, the goal of the therapy is for these symptoms to move closer to your back and eventually resolve. Sometimes, these leg symptoms will get better, but your lower back pain may worsen. This is often an indication of progress in your rehabilitation. Be very alert to any changes in your symptoms and the activities in which you participated in the 24 hours prior to the change. Sharing this information with your caregiver will allow him or her to most efficiently treat your condition. These exercises may help you when beginning to rehabilitate your injury. Your symptoms may resolve with or without further involvement from your physician, physical therapist or athletic trainer. While completing these exercises, remember:   Restoring tissue flexibility helps normal motion to return to the joints. This allows healthier, less painful movement and activity.  An effective stretch should be held for at least 30 seconds.  A stretch should never be painful. You should only feel a gentle lengthening or release in the stretched tissue. FLEXION RANGE OF MOTION AND STRETCHING  EXERCISES:  STRETCH - Flexion, Single Knee to Chest   Lie on a firm bed or floor with both legs extended in front of you.  Keeping one leg in contact with the floor, bring your opposite knee to your chest. Hold your leg in place by either grabbing behind your thigh or at your knee.  Pull until you feel a gentle stretch in your low back. Hold 30 seconds.  Slowly release your grasp and repeat the exercise with the opposite side. Repeat 2 times. Complete this exercise 3 times per week.   STRETCH - Flexion, Double Knee to Chest  Lie on a firm bed or floor with both legs extended in front of you.  Keeping one leg in contact with the floor, bring your opposite knee to your chest.  Tense your stomach muscles to support your back and then lift your other knee to your chest. Hold your legs in place by either grabbing behind your thighs or at your knees.  Pull both knees toward your chest until you feel a gentle stretch in your low back. Hold 30 seconds.  Tense your stomach muscles and slowly return one leg at a time to the floor. Repeat 2 times. Complete this exercise 3 times per week.   STRETCH - Low Trunk Rotation  Lie on a firm bed or floor. Keeping your legs in front of you, bend your knees so they are both pointed toward the ceiling and your feet are flat on the floor.  Extend your arms out to the side. This will stabilize your upper body by keeping  your shoulders in contact with the floor.  Gently and slowly drop both knees together to one side until you feel a gentle stretch in your low back. Hold for 30 seconds.  Tense your stomach muscles to support your lower back as you bring your knees back to the starting position. Repeat the exercise to the other side. Repeat 2 times. Complete this exercise at least 3 times per week.   EXTENSION RANGE OF MOTION AND FLEXIBILITY EXERCISES:  STRETCH - Extension, Prone on Elbows   Lie on your stomach on the floor, a bed will be too soft.  Place your palms about shoulder width apart and at the height of your head.  Place your elbows under your shoulders. If this is too painful, stack pillows under your chest.  Allow your body to relax so that your hips drop lower and make contact more completely with the floor.  Hold this position for 30 seconds.  Slowly return to lying flat on the floor. Repeat 2 times. Complete this exercise 3 times per week.   RANGE OF MOTION - Extension, Prone Press Ups  Lie on your stomach on the floor, a bed will be too soft. Place your palms about shoulder width apart and at the height of your head.  Keeping your back as relaxed as possible, slowly straighten your elbows while keeping your hips on the floor. You may adjust the placement of your hands to maximize your comfort. As you gain motion, your hands will come more underneath your shoulders.  Hold this position 30 seconds.  Slowly return to lying flat on the floor. Repeat 2 times. Complete this exercise 3 times per week.   RANGE OF MOTION- Quadruped, Neutral Spine   Assume a hands and knees position on a firm surface. Keep your hands under your shoulders and your knees under your hips. You may place padding under your knees for comfort.  Drop your head and point your tailbone toward the ground below you. This will round out your lower back like an angry cat. Hold this position for 30 seconds.  Slowly lift your head and release your tail bone so that your back sags into a large arch, like an old horse.  Hold this position for 30 seconds.  Repeat this until you feel limber in your low back.  Now, find your "sweet spot." This will be the most comfortable position somewhere between the two previous positions. This is your neutral spine. Once you have found this position, tense your stomach muscles to support your low back.  Hold this position for 30 seconds. Repeat 2 times. Complete this exercise 3 times per week.   STRENGTHENING  EXERCISES - Low Back Sprain These exercises may help you when beginning to rehabilitate your injury. These exercises should be done near your "sweet spot." This is the neutral, low-back arch, somewhere between fully rounded and fully arched, that is your least painful position. When performed in this safe range of motion, these exercises can be used for people who have either a flexion or extension based injury. These exercises may resolve your symptoms with or without further involvement from your physician, physical therapist or athletic trainer. While completing these exercises, remember:   Muscles can gain both the endurance and the strength needed for everyday activities through controlled exercises.  Complete these exercises as instructed by your physician, physical therapist or athletic trainer. Increase the resistance and repetitions only as guided.  You may experience muscle soreness or fatigue, but the  pain or discomfort you are trying to eliminate should never worsen during these exercises. If this pain does worsen, stop and make certain you are following the directions exactly. If the pain is still present after adjustments, discontinue the exercise until you can discuss the trouble with your caregiver.  STRENGTHENING - Deep Abdominals, Pelvic Tilt   Lie on a firm bed or floor. Keeping your legs in front of you, bend your knees so they are both pointed toward the ceiling and your feet are flat on the floor.  Tense your lower abdominal muscles to press your low back into the floor. This motion will rotate your pelvis so that your tail bone is scooping upwards rather than pointing at your feet or into the floor. With a gentle tension and even breathing, hold this position for 3 seconds. Repeat 2 times. Complete this exercise 3 times per week.   STRENGTHENING - Abdominals, Crunches   Lie on a firm bed or floor. Keeping your legs in front of you, bend your knees so they are both pointed  toward the ceiling and your feet are flat on the floor. Cross your arms over your chest.  Slightly tip your chin down without bending your neck.  Tense your abdominals and slowly lift your trunk high enough to just clear your shoulder blades. Lifting higher can put excessive stress on the lower back and does not further strengthen your abdominal muscles.  Control your return to the starting position. Repeat 2 times. Complete this exercise 3 times per week.   STRENGTHENING - Quadruped, Opposite UE/LE Lift   Assume a hands and knees position on a firm surface. Keep your hands under your shoulders and your knees under your hips. You may place padding under your knees for comfort.  Find your neutral spine and gently tense your abdominal muscles so that you can maintain this position. Your shoulders and hips should form a rectangle that is parallel with the floor and is not twisted.  Keeping your trunk steady, lift your right hand no higher than your shoulder and then your left leg no higher than your hip. Make sure you are not holding your breath. Hold this position for 30 seconds.  Continuing to keep your abdominal muscles tense and your back steady, slowly return to your starting position. Repeat with the opposite arm and leg. Repeat 2 times. Complete this exercise 3 times per week.   STRENGTHENING - Abdominals and Quadriceps, Straight Leg Raise   Lie on a firm bed or floor with both legs extended in front of you.  Keeping one leg in contact with the floor, bend the other knee so that your foot can rest flat on the floor.  Find your neutral spine, and tense your abdominal muscles to maintain your spinal position throughout the exercise.  Slowly lift your straight leg off the floor about 6 inches for a count of 3, making sure to not hold your breath.  Still keeping your neutral spine, slowly lower your leg all the way to the floor. Repeat this exercise with each leg 2 times. Complete this  exercise 3 times per week.  POSTURE AND BODY MECHANICS CONSIDERATIONS - Low Back Sprain Keeping correct posture when sitting, standing or completing your activities will reduce the stress put on different body tissues, allowing injured tissues a chance to heal and limiting painful experiences. The following are general guidelines for improved posture.  While reading these guidelines, remember:  The exercises prescribed by your provider will help  you have the flexibility and strength to maintain correct postures.  The correct posture provides the best environment for your joints to work. All of your joints have less wear and tear when properly supported by a spine with good posture. This means you will experience a healthier, less painful body.  Correct posture must be practiced with all of your activities, especially prolonged sitting and standing. Correct posture is as important when doing repetitive low-stress activities (typing) as it is when doing a single heavy-load activity (lifting).  RESTING POSITIONS Consider which positions are most painful for you when choosing a resting position. If you have pain with flexion-based activities (sitting, bending, stooping, squatting), choose a position that allows you to rest in a less flexed posture. You would want to avoid curling into a fetal position on your side. If your pain worsens with extension-based activities (prolonged standing, working overhead), avoid resting in an extended position such as sleeping on your stomach. Most people will find more comfort when they rest with their spine in a more neutral position, neither too rounded nor too arched. Lying on a non-sagging bed on your side with a pillow between your knees, or on your back with a pillow under your knees will often provide some relief. Keep in mind, being in any one position for a prolonged period of time, no matter how correct your posture, can still lead to stiffness.  PROPER SITTING  POSTURE In order to minimize stress and discomfort on your spine, you must sit with correct posture. Sitting with good posture should be effortless for a healthy body. Returning to good posture is a gradual process. Many people can work toward this most comfortably by using various supports until they have the flexibility and strength to maintain this posture on their own. When sitting with proper posture, your ears will fall over your shoulders and your shoulders will fall over your hips. You should use the back of the chair to support your upper back. Your lower back will be in a neutral position, just slightly arched. You may place a small pillow or folded towel at the base of your lower back for  support.  When working at a desk, create an environment that supports good, upright posture. Without extra support, muscles tire, which leads to excessive strain on joints and other tissues. Keep these recommendations in mind:  CHAIR:  A chair should be able to slide under your desk when your back makes contact with the back of the chair. This allows you to work closely.  The chair's height should allow your eyes to be level with the upper part of your monitor and your hands to be slightly lower than your elbows.  BODY POSITION  Your feet should make contact with the floor. If this is not possible, use a foot rest.  Keep your ears over your shoulders. This will reduce stress on your neck and low back.  INCORRECT SITTING POSTURES  If you are feeling tired and unable to assume a healthy sitting posture, do not slouch or slump. This puts excessive strain on your back tissues, causing more damage and pain. Healthier options include:  Using more support, like a lumbar pillow.  Switching tasks to something that requires you to be upright or walking.  Talking a brief walk.  Lying down to rest in a neutral-spine position.  PROLONGED STANDING WHILE SLIGHTLY LEANING FORWARD  When completing a task  that requires you to lean forward while standing in one place for  a long time, place either foot up on a stationary 2-4 inch high object to help maintain the best posture. When both feet are on the ground, the lower back tends to lose its slight inward curve. If this curve flattens (or becomes too large), then the back and your other joints will experience too much stress, tire more quickly, and can cause pain.  CORRECT STANDING POSTURES Proper standing posture should be assumed with all daily activities, even if they only take a few moments, like when brushing your teeth. As in sitting, your ears should fall over your shoulders and your shoulders should fall over your hips. You should keep a slight tension in your abdominal muscles to brace your spine. Your tailbone should point down to the ground, not behind your body, resulting in an over-extended swayback posture.   INCORRECT STANDING POSTURES  Common incorrect standing postures include a forward head, locked knees and/or an excessive swayback. WALKING Walk with an upright posture. Your ears, shoulders and hips should all line-up.  PROLONGED ACTIVITY IN A FLEXED POSITION When completing a task that requires you to bend forward at your waist or lean over a low surface, try to find a way to stabilize 3 out of 4 of your limbs. You can place a hand or elbow on your thigh or rest a knee on the surface you are reaching across. This will provide you more stability, so that your muscles do not tire as quickly. By keeping your knees relaxed, or slightly bent, you will also reduce stress across your lower back. CORRECT LIFTING TECHNIQUES  DO :  Assume a wide stance. This will provide you more stability and the opportunity to get as close as possible to the object which you are lifting.  Tense your abdominals to brace your spine. Bend at the knees and hips. Keeping your back locked in a neutral-spine position, lift using your leg muscles. Lift with your  legs, keeping your back straight.  Test the weight of unknown objects before attempting to lift them.  Try to keep your elbows locked down at your sides in order get the best strength from your shoulders when carrying an object.     Always ask for help when lifting heavy or awkward objects. INCORRECT LIFTING TECHNIQUES DO NOT:   Lock your knees when lifting, even if it is a small object.  Bend and twist. Pivot at your feet or move your feet when needing to change directions.  Assume that you can safely pick up even a paperclip without proper posture.

## 2018-09-28 ENCOUNTER — Other Ambulatory Visit: Payer: Self-pay | Admitting: Family Medicine

## 2018-09-28 DIAGNOSIS — F418 Other specified anxiety disorders: Secondary | ICD-10-CM

## 2018-11-10 ENCOUNTER — Ambulatory Visit: Payer: Medicare Other | Admitting: Medical

## 2018-11-18 ENCOUNTER — Encounter: Payer: Self-pay | Admitting: Family Medicine

## 2018-11-18 ENCOUNTER — Ambulatory Visit (INDEPENDENT_AMBULATORY_CARE_PROVIDER_SITE_OTHER): Payer: Medicare Other | Admitting: Family Medicine

## 2018-11-18 VITALS — BP 128/90 | HR 62 | Temp 97.7°F | Ht 61.0 in | Wt 141.0 lb

## 2018-11-18 DIAGNOSIS — M545 Low back pain, unspecified: Secondary | ICD-10-CM

## 2018-11-18 MED ORDER — TIZANIDINE HCL 4 MG PO CAPS: 4.0000 mg | ORAL_CAPSULE | Freq: Three times a day (TID) | ORAL | 0 refills | Status: DC | PRN

## 2018-11-18 MED ORDER — MELOXICAM 7.5 MG PO TABS: 7.5000 mg | ORAL_TABLET | Freq: Every day | ORAL | 0 refills | Status: DC

## 2018-11-18 NOTE — Progress Notes (Signed)
Musculoskeletal Exam  Patient: Kelsey Beasley DOB: 06/22/1946  DOS: 11/18/2018  SUBJECTIVE:  Chief Complaint:   Chief Complaint  Patient presents with  . Back Pain    6 days    Kelsey Beasley is a 72 y.o.  female for evaluation and treatment of her back pain.   Onset:  5 days ago. No inj or change in activity.  Location: lower left Character:  aching and sharp  Progression of issue:  is unchanged Associated symptoms: None Denies bowel/bladder incontinence or weakness Treatment: to date has been heat, Tylenol, Aleve.   Neurovascular symptoms: no  ROS: Musculoskeletal/Extremities: +back pain Neurologic: no numbness, tingling no weakness   Past Medical History:  Diagnosis Date  . Achalasia   . Anti-D antibodies present   . Anxiety   . Blood transfusion without reported diagnosis   . Chicken pox   . Depression   . Dysplastic colon polyp   . GERD (gastroesophageal reflux disease)   . Hyperlipidemia   . Hypertension   . Osteopenia   . Prediabetes 09/15/2017  . Shingles     Objective:  VITAL SIGNS: BP 128/90 (BP Location: Left Arm, Patient Position: Sitting, Cuff Size: Normal)   Pulse 62   Temp 97.7 F (36.5 C) (Oral)   Ht 5\' 1"  (1.549 m)   Wt 141 lb (64 kg)   SpO2 96%   BMI 26.64 kg/m  Constitutional: Well formed, well developed. No acute distress. HENT: Normocephalic, atraumatic.  Thorax & Lungs:  No accessory muscle use Extremities: No clubbing. No cyanosis. No edema.  Skin: Warm. Dry. No erythema. No rash.  Musculoskeletal: low back.   Tenderness to palpation: yes, over L lumbar parasp msc Deformity: no Ecchymosis: no Straight leg test: negative for Poor hamstring flexibility b/l. Neurologic: Normal sensory function. No focal deficits noted. DTR's equal and symmetry in LE's. No clonus. Psychiatric: Normal mood. Age appropriate judgment and insight. Alert & oriented x 3.    Assessment:  Acute left-sided low back pain without sciatica - Plan: tiZANidine  (ZANAFLEX) 4 MG capsule, meloxicam (MOBIC) 7.5 MG tablet  Plan: Stretches/exercises, heat, ice, Tylenol. Warnings about msc relaxant causing drowsiness. Low dose Mobic.  F/u prn. The patient voiced understanding and agreement to the plan.   [place], DO 11/18/18  2:21 PM

## 2018-11-18 NOTE — Progress Notes (Signed)
Pre visit review using our clinic review tool, if applicable. No additional management support is needed unless otherwise documented below in the visit note. 

## 2018-11-18 NOTE — Patient Instructions (Signed)
Heat (pad or rice pillow in microwave) over affected area, 10-15 minutes twice daily.   OK to take Tylenol 1000 mg (2 extra strength tabs) or 975 mg (3 regular strength tabs) every 6 hours as needed.  If Zanaflex makes you tired, do not taking during the day.  EXERCISES  RANGE OF MOTION (ROM) AND STRETCHING EXERCISES - Low Back Pain Most people with lower back pain will find that their symptoms get worse with excessive bending forward (flexion) or arching at the lower back (extension). The exercises that will help resolve your symptoms will focus on the opposite motion.  If you have pain, numbness or tingling which travels down into your buttocks, leg or foot, the goal of the therapy is for these symptoms to move closer to your back and eventually resolve. Sometimes, these leg symptoms will get better, but your lower back pain may worsen. This is often an indication of progress in your rehabilitation. Be very alert to any changes in your symptoms and the activities in which you participated in the 24 hours prior to the change. Sharing this information with your caregiver will allow him or her to most efficiently treat your condition. These exercises may help you when beginning to rehabilitate your injury. Your symptoms may resolve with or without further involvement from your physician, physical therapist or athletic trainer. While completing these exercises, remember:   Restoring tissue flexibility helps normal motion to return to the joints. This allows healthier, less painful movement and activity.  An effective stretch should be held for at least 30 seconds.  A stretch should never be painful. You should only feel a gentle lengthening or release in the stretched tissue. FLEXION RANGE OF MOTION AND STRETCHING EXERCISES:  STRETCH - Flexion, Single Knee to Chest   Lie on a firm bed or floor with both legs extended in front of you.  Keeping one leg in contact with the floor, bring your  opposite knee to your chest. Hold your leg in place by either grabbing behind your thigh or at your knee.  Pull until you feel a gentle stretch in your low back. Hold 30 seconds.  Slowly release your grasp and repeat the exercise with the opposite side. Repeat 2 times. Complete this exercise 3 times per week.   STRETCH - Flexion, Double Knee to Chest  Lie on a firm bed or floor with both legs extended in front of you.  Keeping one leg in contact with the floor, bring your opposite knee to your chest.  Tense your stomach muscles to support your back and then lift your other knee to your chest. Hold your legs in place by either grabbing behind your thighs or at your knees.  Pull both knees toward your chest until you feel a gentle stretch in your low back. Hold 30 seconds.  Tense your stomach muscles and slowly return one leg at a time to the floor. Repeat 2 times. Complete this exercise 3 times per week.   STRETCH - Low Trunk Rotation  Lie on a firm bed or floor. Keeping your legs in front of you, bend your knees so they are both pointed toward the ceiling and your feet are flat on the floor.  Extend your arms out to the side. This will stabilize your upper body by keeping your shoulders in contact with the floor.  Gently and slowly drop both knees together to one side until you feel a gentle stretch in your low back. Hold for 30 seconds.  Tense your stomach muscles to support your lower back as you bring your knees back to the starting position. Repeat the exercise to the other side. Repeat 2 times. Complete this exercise at least 3 times per week.   EXTENSION RANGE OF MOTION AND FLEXIBILITY EXERCISES:  STRETCH - Extension, Prone on Elbows   Lie on your stomach on the floor, a bed will be too soft. Place your palms about shoulder width apart and at the height of your head.  Place your elbows under your shoulders. If this is too painful, stack pillows under your chest.  Allow  your body to relax so that your hips drop lower and make contact more completely with the floor.  Hold this position for 30 seconds.  Slowly return to lying flat on the floor. Repeat 2 times. Complete this exercise 3 times per week.   RANGE OF MOTION - Extension, Prone Press Ups  Lie on your stomach on the floor, a bed will be too soft. Place your palms about shoulder width apart and at the height of your head.  Keeping your back as relaxed as possible, slowly straighten your elbows while keeping your hips on the floor. You may adjust the placement of your hands to maximize your comfort. As you gain motion, your hands will come more underneath your shoulders.  Hold this position 30 seconds.  Slowly return to lying flat on the floor. Repeat 2 times. Complete this exercise 3 times per week.   RANGE OF MOTION- Quadruped, Neutral Spine   Assume a hands and knees position on a firm surface. Keep your hands under your shoulders and your knees under your hips. You may place padding under your knees for comfort.  Drop your head and point your tailbone toward the ground below you. This will round out your lower back like an angry cat. Hold this position for 30 seconds.  Slowly lift your head and release your tail bone so that your back sags into a large arch, like an old horse.  Hold this position for 30 seconds.  Repeat this until you feel limber in your low back.  Now, find your "sweet spot." This will be the most comfortable position somewhere between the two previous positions. This is your neutral spine. Once you have found this position, tense your stomach muscles to support your low back.  Hold this position for 30 seconds. Repeat 2 times. Complete this exercise 3 times per week.   STRENGTHENING EXERCISES - Low Back Sprain These exercises may help you when beginning to rehabilitate your injury. These exercises should be done near your "sweet spot." This is the neutral, low-back  arch, somewhere between fully rounded and fully arched, that is your least painful position. When performed in this safe range of motion, these exercises can be used for people who have either a flexion or extension based injury. These exercises may resolve your symptoms with or without further involvement from your physician, physical therapist or athletic trainer. While completing these exercises, remember:   Muscles can gain both the endurance and the strength needed for everyday activities through controlled exercises.  Complete these exercises as instructed by your physician, physical therapist or athletic trainer. Increase the resistance and repetitions only as guided.  You may experience muscle soreness or fatigue, but the pain or discomfort you are trying to eliminate should never worsen during these exercises. If this pain does worsen, stop and make certain you are following the directions exactly. If the pain is  still present after adjustments, discontinue the exercise until you can discuss the trouble with your caregiver.  STRENGTHENING - Deep Abdominals, Pelvic Tilt   Lie on a firm bed or floor. Keeping your legs in front of you, bend your knees so they are both pointed toward the ceiling and your feet are flat on the floor.  Tense your lower abdominal muscles to press your low back into the floor. This motion will rotate your pelvis so that your tail bone is scooping upwards rather than pointing at your feet or into the floor. With a gentle tension and even breathing, hold this position for 3 seconds. Repeat 2 times. Complete this exercise 3 times per week.   STRENGTHENING - Abdominals, Crunches   Lie on a firm bed or floor. Keeping your legs in front of you, bend your knees so they are both pointed toward the ceiling and your feet are flat on the floor. Cross your arms over your chest.  Slightly tip your chin down without bending your neck.  Tense your abdominals and slowly lift  your trunk high enough to just clear your shoulder blades. Lifting higher can put excessive stress on the lower back and does not further strengthen your abdominal muscles.  Control your return to the starting position. Repeat 2 times. Complete this exercise 3 times per week.   STRENGTHENING - Quadruped, Opposite UE/LE Lift   Assume a hands and knees position on a firm surface. Keep your hands under your shoulders and your knees under your hips. You may place padding under your knees for comfort.  Find your neutral spine and gently tense your abdominal muscles so that you can maintain this position. Your shoulders and hips should form a rectangle that is parallel with the floor and is not twisted.  Keeping your trunk steady, lift your right hand no higher than your shoulder and then your left leg no higher than your hip. Make sure you are not holding your breath. Hold this position for 30 seconds.  Continuing to keep your abdominal muscles tense and your back steady, slowly return to your starting position. Repeat with the opposite arm and leg. Repeat 2 times. Complete this exercise 3 times per week.   STRENGTHENING - Abdominals and Quadriceps, Straight Leg Raise   Lie on a firm bed or floor with both legs extended in front of you.  Keeping one leg in contact with the floor, bend the other knee so that your foot can rest flat on the floor.  Find your neutral spine, and tense your abdominal muscles to maintain your spinal position throughout the exercise.  Slowly lift your straight leg off the floor about 6 inches for a count of 3, making sure to not hold your breath.  Still keeping your neutral spine, slowly lower your leg all the way to the floor. Repeat this exercise with each leg 2 times. Complete this exercise 3 times per week.  POSTURE AND BODY MECHANICS CONSIDERATIONS - Low Back Sprain Keeping correct posture when sitting, standing or completing your activities will reduce the  stress put on different body tissues, allowing injured tissues a chance to heal and limiting painful experiences. The following are general guidelines for improved posture.  While reading these guidelines, remember:  The exercises prescribed by your provider will help you have the flexibility and strength to maintain correct postures.  The correct posture provides the best environment for your joints to work. All of your joints have less wear and tear when  properly supported by a spine with good posture. This means you will experience a healthier, less painful body.  Correct posture must be practiced with all of your activities, especially prolonged sitting and standing. Correct posture is as important when doing repetitive low-stress activities (typing) as it is when doing a single heavy-load activity (lifting).  RESTING POSITIONS Consider which positions are most painful for you when choosing a resting position. If you have pain with flexion-based activities (sitting, bending, stooping, squatting), choose a position that allows you to rest in a less flexed posture. You would want to avoid curling into a fetal position on your side. If your pain worsens with extension-based activities (prolonged standing, working overhead), avoid resting in an extended position such as sleeping on your stomach. Most people will find more comfort when they rest with their spine in a more neutral position, neither too rounded nor too arched. Lying on a non-sagging bed on your side with a pillow between your knees, or on your back with a pillow under your knees will often provide some relief. Keep in mind, being in any one position for a prolonged period of time, no matter how correct your posture, can still lead to stiffness.  PROPER SITTING POSTURE In order to minimize stress and discomfort on your spine, you must sit with correct posture. Sitting with good posture should be effortless for a healthy body. Returning to  good posture is a gradual process. Many people can work toward this most comfortably by using various supports until they have the flexibility and strength to maintain this posture on their own. When sitting with proper posture, your ears will fall over your shoulders and your shoulders will fall over your hips. You should use the back of the chair to support your upper back. Your lower back will be in a neutral position, just slightly arched. You may place a small pillow or folded towel at the base of your lower back for  support.  When working at a desk, create an environment that supports good, upright posture. Without extra support, muscles tire, which leads to excessive strain on joints and other tissues. Keep these recommendations in mind:  CHAIR:  A chair should be able to slide under your desk when your back makes contact with the back of the chair. This allows you to work closely.  The chair's height should allow your eyes to be level with the upper part of your monitor and your hands to be slightly lower than your elbows.  BODY POSITION  Your feet should make contact with the floor. If this is not possible, use a foot rest.  Keep your ears over your shoulders. This will reduce stress on your neck and low back.  INCORRECT SITTING POSTURES  If you are feeling tired and unable to assume a healthy sitting posture, do not slouch or slump. This puts excessive strain on your back tissues, causing more damage and pain. Healthier options include:  Using more support, like a lumbar pillow.  Switching tasks to something that requires you to be upright or walking.  Talking a brief walk.  Lying down to rest in a neutral-spine position.  PROLONGED STANDING WHILE SLIGHTLY LEANING FORWARD  When completing a task that requires you to lean forward while standing in one place for a long time, place either foot up on a stationary 2-4 inch high object to help maintain the best posture. When both  feet are on the ground, the lower back tends to lose  its slight inward curve. If this curve flattens (or becomes too large), then the back and your other joints will experience too much stress, tire more quickly, and can cause pain.  CORRECT STANDING POSTURES Proper standing posture should be assumed with all daily activities, even if they only take a few moments, like when brushing your teeth. As in sitting, your ears should fall over your shoulders and your shoulders should fall over your hips. You should keep a slight tension in your abdominal muscles to brace your spine. Your tailbone should point down to the ground, not behind your body, resulting in an over-extended swayback posture.   INCORRECT STANDING POSTURES  Common incorrect standing postures include a forward head, locked knees and/or an excessive swayback. WALKING Walk with an upright posture. Your ears, shoulders and hips should all line-up.  PROLONGED ACTIVITY IN A FLEXED POSITION When completing a task that requires you to bend forward at your waist or lean over a low surface, try to find a way to stabilize 3 out of 4 of your limbs. You can place a hand or elbow on your thigh or rest a knee on the surface you are reaching across. This will provide you more stability, so that your muscles do not tire as quickly. By keeping your knees relaxed, or slightly bent, you will also reduce stress across your lower back. CORRECT LIFTING TECHNIQUES  DO :  Assume a wide stance. This will provide you more stability and the opportunity to get as close as possible to the object which you are lifting.  Tense your abdominals to brace your spine. Bend at the knees and hips. Keeping your back locked in a neutral-spine position, lift using your leg muscles. Lift with your legs, keeping your back straight.  Test the weight of unknown objects before attempting to lift them.  Try to keep your elbows locked down at your sides in order get the best  strength from your shoulders when carrying an object.     Always ask for help when lifting heavy or awkward objects. INCORRECT LIFTING TECHNIQUES DO NOT:   Lock your knees when lifting, even if it is a small object.  Bend and twist. Pivot at your feet or move your feet when needing to change directions.  Assume that you can safely pick up even a paperclip without proper posture.

## 2018-12-17 ENCOUNTER — Telehealth: Payer: Self-pay | Admitting: Family Medicine

## 2018-12-17 DIAGNOSIS — I10 Essential (primary) hypertension: Secondary | ICD-10-CM

## 2018-12-17 MED ORDER — LISINOPRIL-HYDROCHLOROTHIAZIDE 20-12.5 MG PO TABS: 1.0000 | ORAL_TABLET | Freq: Every day | ORAL | 3 refills | Status: DC

## 2018-12-17 NOTE — Telephone Encounter (Signed)
Refill of BP med done/patient notified.

## 2018-12-17 NOTE — Telephone Encounter (Signed)
Copied from [place] [id number] [id number]. Topic: Quick Communication - Rx Refill/Question >> Dec 17, 2018  3:05 PM Percell Belt A wrote: Medication:   Has the patient contacted their pharmacy? No. (Agent: If no, request that the patient contact the pharmacy for the refill.) (Agent: If yes, when and what did the pharmacy advise?)  Preferred Pharmacy (with phone number or street name):   Agent: Please be advised that RX refills may take up to 3 business days. We ask that you follow-up with your pharmacy.

## 2018-12-25 ENCOUNTER — Other Ambulatory Visit: Payer: Self-pay | Admitting: Family Medicine

## 2018-12-25 DIAGNOSIS — E785 Hyperlipidemia, unspecified: Secondary | ICD-10-CM

## 2018-12-25 DIAGNOSIS — F411 Generalized anxiety disorder: Secondary | ICD-10-CM

## 2018-12-25 DIAGNOSIS — F418 Other specified anxiety disorders: Secondary | ICD-10-CM

## 2018-12-25 MED ORDER — ESCITALOPRAM OXALATE 20 MG PO TABS: 20.0000 mg | ORAL_TABLET | Freq: Every day | ORAL | 0 refills | Status: DC

## 2018-12-25 MED ORDER — BUSPIRONE HCL 10 MG PO TABS: 10.0000 mg | ORAL_TABLET | Freq: Two times a day (BID) | ORAL | 3 refills | Status: DC

## 2018-12-25 MED ORDER — EZETIMIBE 10 MG PO TABS: 10.0000 mg | ORAL_TABLET | Freq: Every day | ORAL | 0 refills | Status: DC

## 2018-12-25 MED ORDER — PRAVASTATIN SODIUM 40 MG PO TABS: 40.0000 mg | ORAL_TABLET | Freq: Every day | ORAL | 0 refills | Status: DC

## 2018-12-25 NOTE — Telephone Encounter (Signed)
Copied from [place] [id number]. Topic: Quick Communication - Rx Refill/Question >> Dec 25, 2018 12:07 PM Percell Belt A wrote: Medication:  busPIRone (BUSPAR) 10 MG tablet [[id number]]  ezetimibe (ZETIA) 10 MG tablet [[id number]]  pravastatin (PRAVACHOL) 40 MG tablet [[id number]]  escitalopram (LEXAPRO) 20 MG tablet [[id number]] Pt is going out of town and is requesting refills today.  She is going out of town this week and would like a small 30 day supply sent to local pharmacy ([place]) and [facility] supply to [facility] Has the patient contacted their pharmacy? No- (Agent: If no, request that the patient contact the pharmacy for the refill.) (Agent: If yes, when and what did the pharmacy advise?)  Preferred Pharmacy (with phone number or street name): [place] [phone] (Phone)   Agent: Please be advised that RX refills may take up to 3 business days. We ask that you follow-up with your pharmacy.

## 2018-12-25 NOTE — Telephone Encounter (Signed)
Requested medication (s) are due for refill today: no  Requested medication (s) are on the active medication list: yes  Last refill:  09/25/18  Future visit scheduled: yes  Notes to clinic:  Not delegated to [facility] to refill   Requested Prescriptions  Pending Prescriptions Disp Refills   busPIRone (BUSPAR) 10 MG tablet 180 tablet 3    Sig: Take 1 tablet (10 mg total) by mouth 2 (two) times daily.     Not Delegated - Psychiatry:  Anxiolytics/Hypnotics Failed - 12/25/2018  2:10 PM      Failed - This refill cannot be delegated      Failed - Urine Drug Screen completed in last 360 days.      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

## 2018-12-25 NOTE — Telephone Encounter (Signed)
Accidentally refilled Buspar. Called Jasmine at the PCP office and informed her and called Sam at the [facility] [address].  Sam (pharmacist) is cancelling the refill.

## 2018-12-25 NOTE — Addendum Note (Signed)
Addended by: Corky Sox E on: 12/25/2018 02:08 PM   Modules accepted: Orders

## 2019-01-27 DIAGNOSIS — H524 Presbyopia: Secondary | ICD-10-CM | POA: Diagnosis not present

## 2019-01-27 DIAGNOSIS — H2513 Age-related nuclear cataract, bilateral: Secondary | ICD-10-CM | POA: Diagnosis not present

## 2019-03-24 ENCOUNTER — Other Ambulatory Visit: Payer: Self-pay | Admitting: Family Medicine

## 2019-03-24 DIAGNOSIS — F411 Generalized anxiety disorder: Secondary | ICD-10-CM

## 2019-03-26 ENCOUNTER — Other Ambulatory Visit: Payer: Self-pay

## 2019-03-26 ENCOUNTER — Encounter: Payer: Self-pay | Admitting: Family Medicine

## 2019-03-26 ENCOUNTER — Ambulatory Visit (INDEPENDENT_AMBULATORY_CARE_PROVIDER_SITE_OTHER): Payer: Medicare Other | Admitting: Family Medicine

## 2019-03-26 DIAGNOSIS — I10 Essential (primary) hypertension: Secondary | ICD-10-CM | POA: Diagnosis not present

## 2019-03-26 DIAGNOSIS — F411 Generalized anxiety disorder: Secondary | ICD-10-CM

## 2019-03-26 DIAGNOSIS — K219 Gastro-esophageal reflux disease without esophagitis: Secondary | ICD-10-CM

## 2019-03-26 DIAGNOSIS — E785 Hyperlipidemia, unspecified: Secondary | ICD-10-CM | POA: Diagnosis not present

## 2019-03-26 MED ORDER — EZETIMIBE 10 MG PO TABS: 10.0000 mg | ORAL_TABLET | Freq: Every day | ORAL | 2 refills | Status: DC

## 2019-03-26 MED ORDER — PRAVASTATIN SODIUM 40 MG PO TABS: 40.0000 mg | ORAL_TABLET | Freq: Every day | ORAL | 2 refills | Status: DC

## 2019-03-26 NOTE — Progress Notes (Signed)
Virtual Visit via Telephone Note  I connected with Kelsey Beasley on 03/26/19 at  1:00 PM EDT by telephone and verified that I am speaking with the correct person using two identifiers.   I discussed the limitations, risks, security and privacy concerns of performing an evaluation and management service by telephone and the availability of in person appointments. I also discussed with the patient that there may be a patient responsible charge related to this service. The patient expressed understanding and agreed to proceed.   History of Present Illness: Patient titrated over the phone as the visit technology was not functioning.  She is doing well with her blood pressure.  She does check and it usually runs 120s/70s.  She is compliant with her Prinzide 20-12.5 mg daily.  She tries to stay active, diet is healthy overall.  No myalgias from pravastatin.  Reflux is well controlled with Protonix.  She stopped taking ranitidine is doing fine.  Mood is well controlled with BuSpar and Lexapro.  She rarely uses hydroxyzine.   Observations/Objective: Age-appropriate judgment and insight, normal affect and mood No conversational dyspnea  Assessment and Plan: Hyperlipidemia, unspecified hyperlipidemia type - Plan: ezetimibe (ZETIA) 10 MG tablet, pravastatin (PRAVACHOL) 40 MG tablet  Generalized anxiety disorder  Gastroesophageal reflux disease without esophagitis  Essential hypertension, benign  Cont current meds. Counseled on diet and exercise. I think she is doing well.   Follow Up Instructions: F/u in 6 mo.    I discussed the assessment and treatment plan with the patient. The patient was provided an opportunity to ask questions and all were answered. The patient agreed with the plan and demonstrated an understanding of the instructions.   The patient was advised to call back or seek an in-person evaluation if the symptoms worsen or if the condition fails to improve as anticipated.  I provided  8 minutes of non-face-to-face time during this encounter.   [place], DO

## 2019-04-28 DIAGNOSIS — L255 Unspecified contact dermatitis due to plants, except food: Secondary | ICD-10-CM | POA: Diagnosis not present

## 2019-05-25 ENCOUNTER — Encounter: Payer: Self-pay | Admitting: Family Medicine

## 2019-05-25 ENCOUNTER — Ambulatory Visit (INDEPENDENT_AMBULATORY_CARE_PROVIDER_SITE_OTHER): Payer: Medicare Other | Admitting: Family Medicine

## 2019-05-25 DIAGNOSIS — N3000 Acute cystitis without hematuria: Secondary | ICD-10-CM

## 2019-05-25 MED ORDER — CEPHALEXIN 500 MG PO CAPS: 500.0000 mg | ORAL_CAPSULE | Freq: Two times a day (BID) | ORAL | 0 refills | Status: AC

## 2019-05-25 NOTE — Progress Notes (Signed)
Chief Complaint  Patient presents with  . Dysuria    Kelsey Beasley is a 73 y.o. female here for possible UTI. Due to COVID-19 pandemic, we are interacting via web portal for an electronic face-to-face visit. I verified patient's ID using 2 identifiers. Patient agreed to proceed with visit via this method. Patient is at home, I am at home. Patient and I are present for visit.   Duration: 1 week. Symptoms: urinary frequency, urinary hesitancy, urinary retention, urinary incontinence and burning Denies: hematuria, fever, nausea and vomiting, vaginal discharge Hx of recurrent UTI? No  ROS:  Constitutional: denies fever GU: As noted in HPI  Past Medical History:  Diagnosis Date  . Achalasia   . Anti-D antibodies present   . Anxiety   . Blood transfusion without reported diagnosis   . Chicken pox   . Depression   . Dysplastic colon polyp   . GERD (gastroesophageal reflux disease)   . Hyperlipidemia   . Hypertension   . Osteopenia   . Prediabetes 09/15/2017  . Shingles    Exam No conversational dyspnea Age appropriate judgment and insight Nml affect and mood  Acute cystitis without hematuria - Plan: cephALEXin (KEFLEX) 500 MG capsule  Orders as above. Stay hydrated. Seek immediate care if pt starts to develop fevers, new/worsening symptoms, uncontrollable N/V. F/u prn. The patient voiced understanding and agreement to the plan.  [place], DO 05/25/19 3:58 PM

## 2019-05-27 DIAGNOSIS — Z1231 Encounter for screening mammogram for malignant neoplasm of breast: Secondary | ICD-10-CM | POA: Diagnosis not present

## 2019-05-27 DIAGNOSIS — Z1239 Encounter for other screening for malignant neoplasm of breast: Secondary | ICD-10-CM | POA: Diagnosis not present

## 2019-06-02 DIAGNOSIS — K635 Polyp of colon: Secondary | ICD-10-CM | POA: Diagnosis not present

## 2019-06-02 DIAGNOSIS — Z1211 Encounter for screening for malignant neoplasm of colon: Secondary | ICD-10-CM | POA: Diagnosis not present

## 2019-06-02 DIAGNOSIS — K648 Other hemorrhoids: Secondary | ICD-10-CM | POA: Diagnosis not present

## 2019-06-02 DIAGNOSIS — K5730 Diverticulosis of large intestine without perforation or abscess without bleeding: Secondary | ICD-10-CM | POA: Diagnosis not present

## 2019-06-02 DIAGNOSIS — Z980 Intestinal bypass and anastomosis status: Secondary | ICD-10-CM | POA: Diagnosis not present

## 2019-06-02 DIAGNOSIS — Z86010 Personal history of colonic polyps: Secondary | ICD-10-CM | POA: Diagnosis not present

## 2019-06-09 ENCOUNTER — Other Ambulatory Visit: Payer: Self-pay | Admitting: Family Medicine

## 2019-06-09 DIAGNOSIS — K219 Gastro-esophageal reflux disease without esophagitis: Secondary | ICD-10-CM

## 2019-07-22 IMAGING — DX DG HIP (WITH OR WITHOUT PELVIS) 2-3V*L*
3 series · 3 of 3 positions shown · non-contrast
Comparison: None.

CLINICAL DATA: Left hip pain without known trauma.

EXAM:
DG HIP (WITH OR WITHOUT PELVIS) 2-3V LEFT

[pelvis ap]
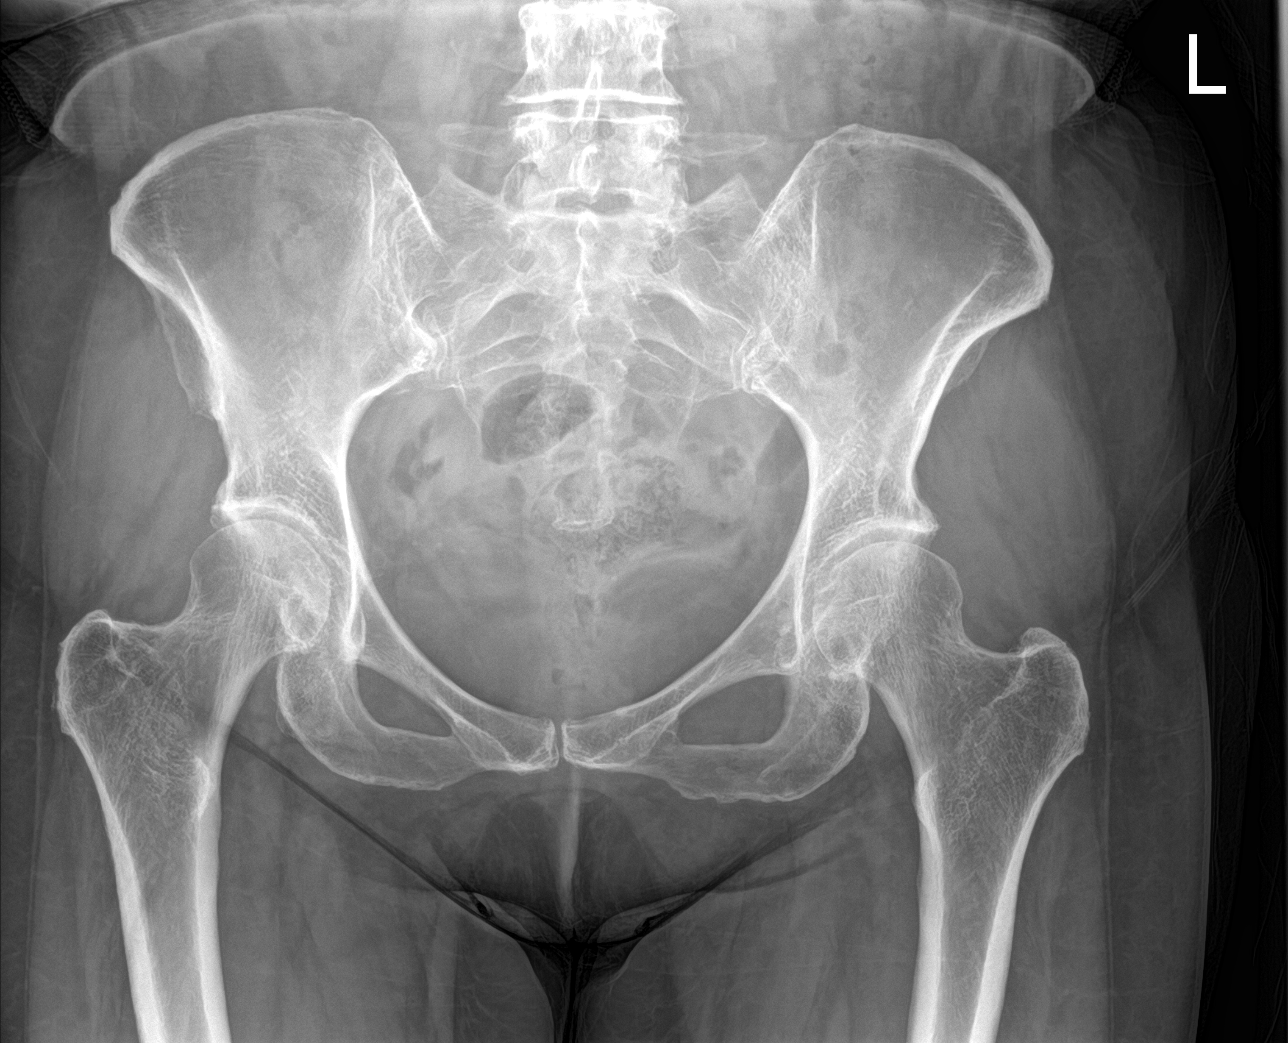

[hip ap]
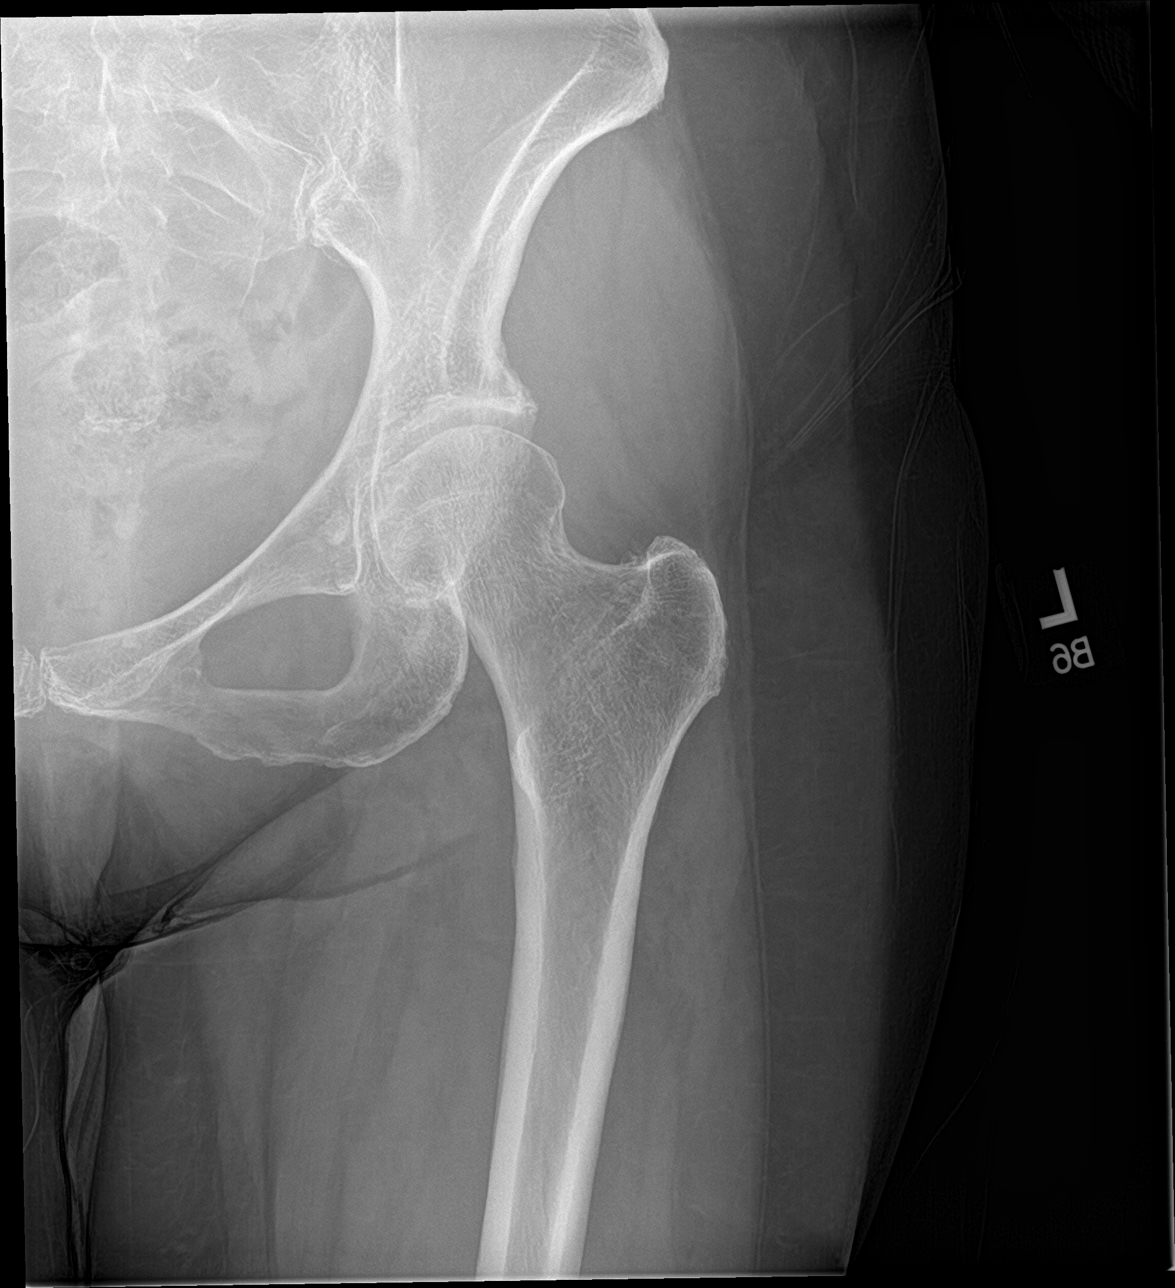

[hip lat]
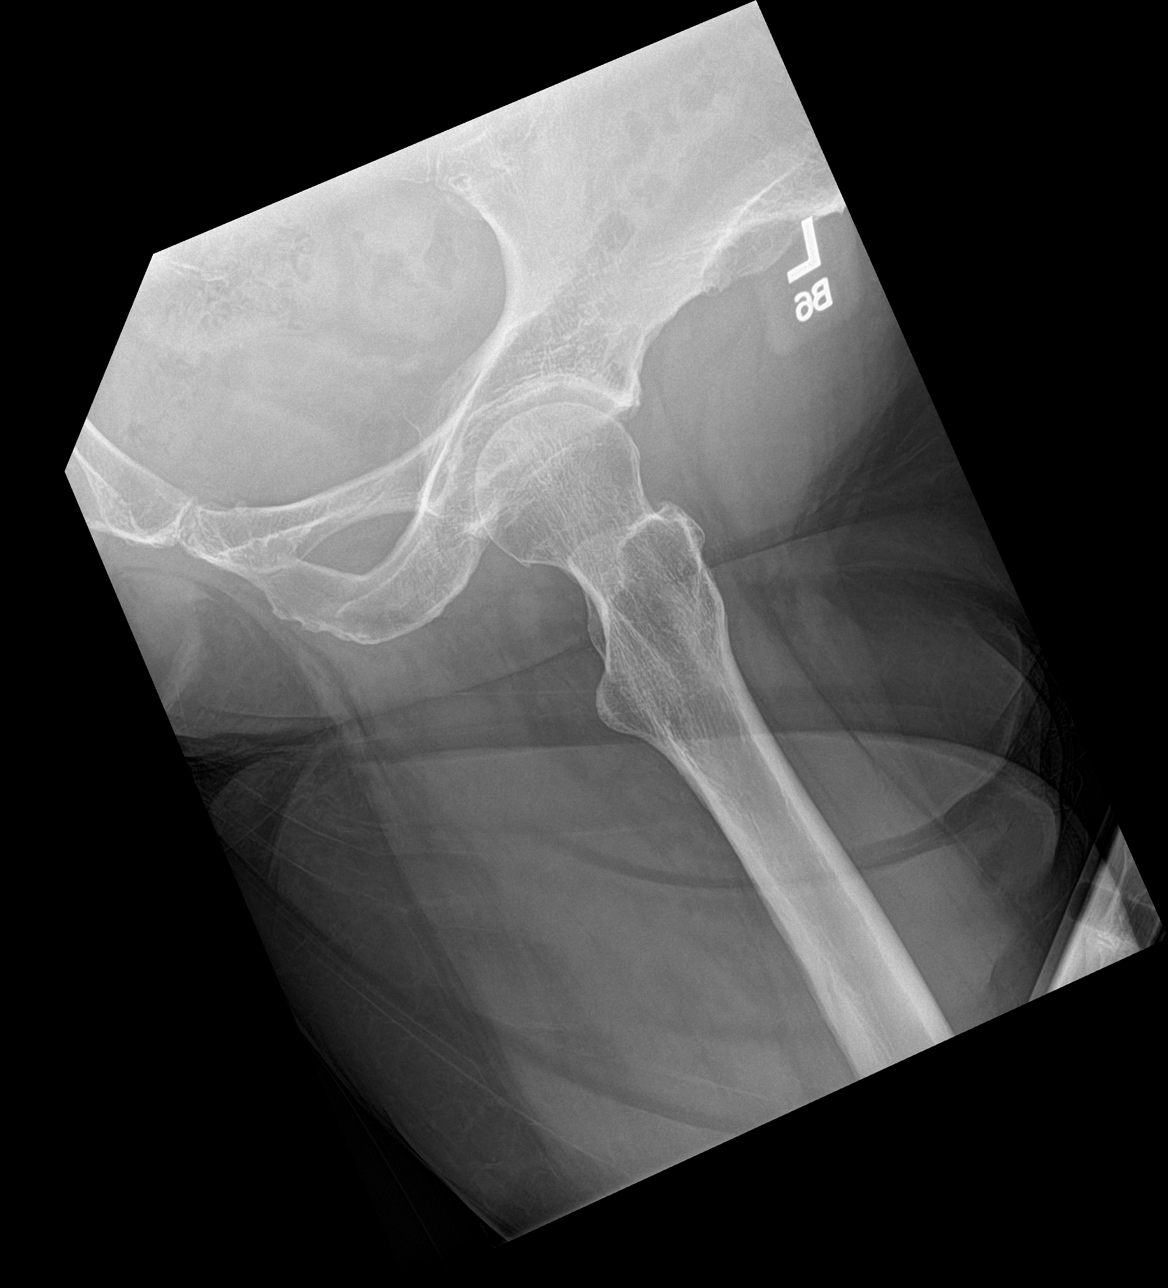

[3 of 3 positions shown; findings below may reference images not displayed]

FINDINGS: Mild degenerative changes in the hips.  No other abnormalities.
IMPRESSION: Mild degenerative changes in the hips.

## 2019-07-29 ENCOUNTER — Other Ambulatory Visit: Payer: Self-pay

## 2019-09-01 ENCOUNTER — Other Ambulatory Visit: Payer: Self-pay | Admitting: Family Medicine

## 2019-09-01 DIAGNOSIS — F411 Generalized anxiety disorder: Secondary | ICD-10-CM

## 2019-09-01 DIAGNOSIS — E785 Hyperlipidemia, unspecified: Secondary | ICD-10-CM

## 2019-09-02 ENCOUNTER — Telehealth: Payer: Self-pay | Admitting: *Deleted

## 2019-09-02 NOTE — Telephone Encounter (Signed)
Copied from [place] [id number]. Topic: General - Call Back - No Documentation >> Sep 02, 2019 12:30 PM Mcneil, Ja-Kwan wrote: Reason for CRM: Pt stated she was returning the call to the office from a missed call she had. Pt requests call back

## 2019-09-03 NOTE — Telephone Encounter (Signed)
Called informed the patient that I had not called her and did not see in her chart any outgoing calls.

## 2019-09-03 NOTE — Telephone Encounter (Signed)
Called left message to call back 

## 2019-09-03 NOTE — Telephone Encounter (Signed)
Patient called in returning call from [place]. Please advise.

## 2019-11-04 ENCOUNTER — Ambulatory Visit: Payer: Medicare Other

## 2019-12-20 ENCOUNTER — Other Ambulatory Visit: Payer: Self-pay | Admitting: Family Medicine

## 2019-12-20 DIAGNOSIS — F418 Other specified anxiety disorders: Secondary | ICD-10-CM

## 2019-12-20 MED ORDER — BUSPIRONE HCL 10 MG PO TABS: 10.0000 mg | ORAL_TABLET | Freq: Two times a day (BID) | ORAL | 0 refills | Status: DC

## 2019-12-22 ENCOUNTER — Other Ambulatory Visit: Payer: Self-pay | Admitting: *Deleted

## 2019-12-22 ENCOUNTER — Other Ambulatory Visit: Payer: Self-pay

## 2019-12-22 ENCOUNTER — Ambulatory Visit (INDEPENDENT_AMBULATORY_CARE_PROVIDER_SITE_OTHER): Payer: Medicare Other | Admitting: *Deleted

## 2019-12-22 DIAGNOSIS — Z23 Encounter for immunization: Secondary | ICD-10-CM

## 2019-12-22 DIAGNOSIS — I10 Essential (primary) hypertension: Secondary | ICD-10-CM

## 2019-12-22 MED ORDER — LISINOPRIL-HYDROCHLOROTHIAZIDE 20-12.5 MG PO TABS: 1.0000 | ORAL_TABLET | Freq: Every day | ORAL | 3 refills | Status: DC

## 2019-12-22 NOTE — Progress Notes (Signed)
Patient here today for flu vaccine.  Vaccine given in left deltoid and patient tolerated well.

## 2020-01-03 ENCOUNTER — Other Ambulatory Visit: Payer: Self-pay

## 2020-01-03 ENCOUNTER — Encounter: Payer: Self-pay | Admitting: Family Medicine

## 2020-01-03 ENCOUNTER — Ambulatory Visit (INDEPENDENT_AMBULATORY_CARE_PROVIDER_SITE_OTHER): Payer: Medicare Other | Admitting: Family Medicine

## 2020-01-03 VITALS — Ht 62.0 in | Wt 125.0 lb

## 2020-01-03 DIAGNOSIS — R7303 Prediabetes: Secondary | ICD-10-CM

## 2020-01-03 DIAGNOSIS — E7800 Pure hypercholesterolemia, unspecified: Secondary | ICD-10-CM

## 2020-01-03 DIAGNOSIS — I10 Essential (primary) hypertension: Secondary | ICD-10-CM | POA: Diagnosis not present

## 2020-01-03 DIAGNOSIS — F418 Other specified anxiety disorders: Secondary | ICD-10-CM | POA: Diagnosis not present

## 2020-01-03 NOTE — Progress Notes (Signed)
Chief Complaint  Patient presents with  . Anxiety    labs  . Hypertension    Subjective Kelsey Beasley presents for f/u anxiety/depression. Due to COVID-19 pandemic, we are interacting via web portal for an electronic face-to-face visit. I verified patient's ID using 2 identifiers. Patient agreed to proceed with visit via this method. Patient is at home, I am at office. Patient and I are present for visit.   She is currently being treated with Lexapro 20 mg/d, BuSpar 10 mg bid.  Reports doing well, no AE's or compliance issues. No thoughts of harming self or others. No self-medication with alcohol, prescription drugs or illicit drugs.  Hypertension Patient presents for hypertension follow up. She does not always monitor home blood pressures. Ck'd today and it was 124/78. She is compliant with medications- Zestoretic 20-12.5 mg/d. Patient has these side effects of medication: none She is adhering to a healthy diet overall. Exercise: walking  Hyperlipidemia Patient presents for dyslipidemia follow up. Currently being treated with Zetia 10 mg/d and compliance with treatment thus far has been good. She denies myalgias. Diet and exercise as above.  The patient is not known to have coexisting coronary artery disease.  ROS Psych: No homicidal or suicidal thoughts  Past Medical History:  Diagnosis Date  . Achalasia   . Anti-D antibodies present   . Anxiety   . Blood transfusion without reported diagnosis   . Chicken pox   . Depression   . Dysplastic colon polyp   . GERD (gastroesophageal reflux disease)   . Hyperlipidemia   . Hypertension   . Osteopenia   . Prediabetes 09/15/2017  . Shingles    Allergies as of 01/03/2020   No Known Allergies     Medication List       Accurate as of January 03, 2020 12:30 PM. If you have any questions, ask your nurse or doctor.        acetaminophen 325 MG tablet Commonly known as: TYLENOL Take 650 mg by mouth every 6 (six) hours as  needed (Knee pain).   busPIRone 10 MG tablet Commonly known as: BUSPAR Take 1 tablet (10 mg total) by mouth 2 (two) times daily.   diclofenac sodium 1 % Gel Commonly known as: VOLTAREN Apply topically 4 (four) times daily. Left Knee   escitalopram 20 MG tablet Commonly known as: LEXAPRO TAKE 1 TABLET DAILY   ezetimibe 10 MG tablet Commonly known as: ZETIA TAKE 1 TABLET DAILY   fluticasone 50 MCG/ACT nasal spray Commonly known as: FLONASE Place 2 sprays into both nostrils daily.   hydrOXYzine 25 MG tablet Commonly known as: ATARAX/VISTARIL TAKE 1 TABLET EVERY 8 HOURS AS NEEDED FOR ANXIETY   lisinopril-hydrochlorothiazide 20-12.5 MG tablet Commonly known as: ZESTORETIC Take 1 tablet by mouth daily.   MULTIVITAMIN & MINERAL PO Take by mouth.   pantoprazole 40 MG tablet Commonly known as: PROTONIX TAKE 1 TABLET DAILY   pravastatin 40 MG tablet Commonly known as: PRAVACHOL TAKE 1 TABLET DAILY       Exam Ht 5' 2" (1.575 m)   Wt 125 lb (56.7 kg)   BMI 22.86 kg/m  No conversational dyspnea Age appropriate judgment and insight Nml affect and mood  Assessment and Plan  Situational anxiety  Essential hypertension, benign - Plan: Comp Met (CMET)  Pure hypercholesterolemia - Plan: Comp Met (CMET), Lipid Profile  Prediabetes - Plan: HgB A1c  1- Cont meds 2- Cont meds, counseled on diet and exercse. 3- cont meds, ck labs. F/u  in 6 mo or prn. The patient voiced understanding and agreement to the plan.  [place], DO 01/03/20 12:30 PM

## 2020-01-04 ENCOUNTER — Other Ambulatory Visit: Payer: Self-pay

## 2020-01-05 ENCOUNTER — Other Ambulatory Visit (INDEPENDENT_AMBULATORY_CARE_PROVIDER_SITE_OTHER): Payer: Medicare Other

## 2020-01-05 DIAGNOSIS — R7303 Prediabetes: Secondary | ICD-10-CM

## 2020-01-05 DIAGNOSIS — E7800 Pure hypercholesterolemia, unspecified: Secondary | ICD-10-CM

## 2020-01-05 DIAGNOSIS — I10 Essential (primary) hypertension: Secondary | ICD-10-CM

## 2020-01-05 LAB — COMPREHENSIVE METABOLIC PANEL
ALT: 12 U/L (ref 0–35)
AST: 23 U/L (ref 0–37)
Albumin: 4.1 g/dL (ref 3.5–5.2)
Alkaline Phosphatase: 75 U/L (ref 39–117)
BUN: 14 mg/dL (ref 6–23)
CO2: 30 mEq/L (ref 19–32)
Calcium: 9.5 mg/dL (ref 8.4–10.5)
Chloride: 102 mEq/L (ref 96–112)
Creatinine, Ser: 0.75 mg/dL (ref 0.40–1.20)
GFR: 75.78 mL/min (ref >60.00)
Glucose, Bld: 97 mg/dL (ref 70–99)
Potassium: 3.4 mEq/L — ABNORMAL LOW (ref 3.5–5.1)
Sodium: 139 mEq/L (ref 135–145)
Total Bilirubin: 0.6 mg/dL (ref 0.2–1.2)
Total Protein: 6.9 g/dL (ref 6.0–8.3)

## 2020-01-05 LAB — LIPID PANEL
Cholesterol: 177 mg/dL (ref 0–200)
HDL: 52 mg/dL (ref >39.00)
LDL Cholesterol: 106 mg/dL — ABNORMAL HIGH (ref 0–99)
NonHDL: 125.19
Total CHOL/HDL Ratio: 3
Triglycerides: 98 mg/dL (ref 0.0–149.0)
VLDL: 19.6 mg/dL (ref 0.0–40.0)

## 2020-01-05 LAB — HEMOGLOBIN A1C: Hgb A1c MFr Bld: 6 % (ref 4.6–6.5)

## 2020-07-25 ENCOUNTER — Other Ambulatory Visit: Payer: Self-pay | Admitting: Family Medicine

## 2020-07-25 DIAGNOSIS — K219 Gastro-esophageal reflux disease without esophagitis: Secondary | ICD-10-CM

## 2020-07-31 DIAGNOSIS — Z23 Encounter for immunization: Secondary | ICD-10-CM | POA: Diagnosis not present

## 2020-08-10 DIAGNOSIS — Z1231 Encounter for screening mammogram for malignant neoplasm of breast: Secondary | ICD-10-CM | POA: Diagnosis not present

## 2020-10-25 ENCOUNTER — Encounter: Payer: Self-pay | Admitting: Family Medicine

## 2020-10-25 ENCOUNTER — Ambulatory Visit (INDEPENDENT_AMBULATORY_CARE_PROVIDER_SITE_OTHER): Payer: Medicare Other | Admitting: Family Medicine

## 2020-10-25 ENCOUNTER — Other Ambulatory Visit: Payer: Self-pay

## 2020-10-25 VITALS — BP 108/72 | HR 79 | Temp 98.4°F | Ht 61.0 in | Wt 131.0 lb

## 2020-10-25 DIAGNOSIS — Z23 Encounter for immunization: Secondary | ICD-10-CM

## 2020-10-25 DIAGNOSIS — R7303 Prediabetes: Secondary | ICD-10-CM | POA: Diagnosis not present

## 2020-10-25 DIAGNOSIS — I10 Essential (primary) hypertension: Secondary | ICD-10-CM | POA: Diagnosis not present

## 2020-10-25 DIAGNOSIS — F411 Generalized anxiety disorder: Secondary | ICD-10-CM | POA: Diagnosis not present

## 2020-10-25 DIAGNOSIS — E7800 Pure hypercholesterolemia, unspecified: Secondary | ICD-10-CM | POA: Diagnosis not present

## 2020-10-25 DIAGNOSIS — Z1159 Encounter for screening for other viral diseases: Secondary | ICD-10-CM

## 2020-10-25 MED ORDER — LISINOPRIL-HYDROCHLOROTHIAZIDE 20-12.5 MG PO TABS: 1.0000 | ORAL_TABLET | Freq: Every day | ORAL | 3 refills | Status: DC

## 2020-10-25 MED ORDER — EZETIMIBE 10 MG PO TABS: 10.0000 mg | ORAL_TABLET | Freq: Every day | ORAL | 3 refills | Status: DC

## 2020-10-25 MED ORDER — PRAVASTATIN SODIUM 40 MG PO TABS: 40.0000 mg | ORAL_TABLET | Freq: Every day | ORAL | 3 refills | Status: DC

## 2020-10-25 MED ORDER — ESCITALOPRAM OXALATE 20 MG PO TABS: 20.0000 mg | ORAL_TABLET | Freq: Every day | ORAL | 3 refills | Status: DC

## 2020-10-25 MED ORDER — HYDROXYZINE HCL 25 MG PO TABS: ORAL_TABLET | ORAL | 2 refills | Status: DC

## 2020-10-25 NOTE — Patient Instructions (Signed)
Give us 2-3 business days to get the results of your labs back.   Keep the diet clean and stay active.  Let us know if you need anything. 

## 2020-10-25 NOTE — Progress Notes (Signed)
Chief Complaint  Patient presents with  . Follow-up    Subjective Kelsey Beasley presents for f/u anxiety/depression.  Pt is currently being treated with Lexapro 20 mg/d, Vistaril prn.  Reports doing well since treatment. No thoughts of harming self or others. No self-medication with alcohol, prescription drugs or illicit drugs. Pt is not following with a counselor/psychologist.  Hypertension Patient presents for hypertension follow up. She does monitor home blood pressures. She is compliant with medications- Zestoretic 20-12.5 mg/d. Patient has these side effects of medication: none She is adhering to a healthy diet overall. Exercise: staying active in the house  Hyperlipidemia Patient presents for dyslipidemia follow up. Currently being treated with Zetia 10 mg/d, pravastatin 40 mg/d and compliance with treatment thus far has been good. She denies myalgias. Diet/exercise as above.  The patient is not known to have coexisting coronary artery disease.  Past Medical History:  Diagnosis Date  . Achalasia   . Anti-D antibodies present   . Anxiety   . Blood transfusion without reported diagnosis   . Chicken pox   . Depression   . Dysplastic colon polyp   . GERD (gastroesophageal reflux disease)   . Hyperlipidemia   . Hypertension   . Osteopenia   . Prediabetes 09/15/2017  . Shingles    Allergies as of 10/25/2020   No Known Allergies     Medication List       Accurate as of October 25, 2020  3:52 PM. If you have any questions, ask your nurse or doctor.        STOP taking these medications   busPIRone 10 MG tablet Commonly known as: BUSPAR Stopped by: Shelda Pal, DO     TAKE these medications   acetaminophen 325 MG tablet Commonly known as: TYLENOL Take 650 mg by mouth every 6 (six) hours as needed (Knee pain).   diclofenac sodium 1 % Gel Commonly known as: VOLTAREN Apply topically 4 (four) times daily. Left Knee   escitalopram 20 MG  tablet Commonly known as: LEXAPRO Take 1 tablet (20 mg total) by mouth daily.   ezetimibe 10 MG tablet Commonly known as: ZETIA Take 1 tablet (10 mg total) by mouth daily.   fluticasone 50 MCG/ACT nasal spray Commonly known as: FLONASE Place 2 sprays into both nostrils daily.   hydrOXYzine 25 MG tablet Commonly known as: ATARAX/VISTARIL TAKE 1 TABLET EVERY 8 HOURS AS NEEDED FOR ANXIETY   lisinopril-hydrochlorothiazide 20-12.5 MG tablet Commonly known as: ZESTORETIC Take 1 tablet by mouth daily.   MULTIVITAMIN & MINERAL PO Take by mouth.   pantoprazole 40 MG tablet Commonly known as: PROTONIX TAKE 1 TABLET DAILY   pravastatin 40 MG tablet Commonly known as: PRAVACHOL Take 1 tablet (40 mg total) by mouth daily.       Exam BP 108/72 (BP Location: Left Arm, Patient Position: Sitting, Cuff Size: Normal)   Pulse 79   Temp 98.4 F (36.9 C) (Oral)   Ht 5\' 1"  (1.549 m)   Wt 131 lb (59.4 kg)   SpO2 100%   BMI 24.75 kg/m  General:  well developed, well nourished, in no apparent distress Heart: RRR. No bruits, no LE edema Lungs:  CTAB. No respiratory distress Psych: well oriented with normal range of affect and age-appropriate judgement/insight, alert and oriented x4.  Assessment and Plan  Generalized anxiety disorder - Plan: escitalopram (LEXAPRO) 20 MG tablet, hydrOXYzine (ATARAX/VISTARIL) 25 MG tablet  Essential hypertension, benign - Plan: lisinopril-hydrochlorothiazide (ZESTORETIC) 20-12.5 MG tablet  Pure hypercholesterolemia -  Plan: ezetimibe (ZETIA) 10 MG tablet, pravastatin (PRAVACHOL) 40 MG tablet, Lipid panel, Comprehensive metabolic panel  Encounter for hepatitis C screening test for low risk patient - Plan: Hepatitis C antibody  Prediabetes - Plan: Hemoglobin A1c  Need for influenza vaccination - Plan: Flu Vaccine QUAD High Dose(Fluad)  1. Cont Lexapro 20 mg/d and Hydroxyzine 25 mg prn. No AE's. She is well controlled. 2. Cont Zestoretic 20-12.5 mg/d.  Does not need to monitor BP at home. Counseled on diet/exercise. 3. Cont pravastatin 40 mg/d and Zetia 10 mg/d. 4/5. Monitor labs.  F/u in 6 mo. The patient voiced understanding and agreement to the plan.  [place], DO 10/25/20 3:52 PM

## 2020-10-26 LAB — COMPREHENSIVE METABOLIC PANEL
AG Ratio: 1.6 (calc) (ref 1.0–2.5)
ALT: 9 U/L (ref 6–29)
AST: 20 U/L (ref 10–35)
Albumin: 4.2 g/dL (ref 3.6–5.1)
Alkaline phosphatase (APISO): 77 U/L (ref 37–153)
BUN: 11 mg/dL (ref 7–25)
CO2: 26 mmol/L (ref 20–32)
Calcium: 9.7 mg/dL (ref 8.6–10.4)
Chloride: 99 mmol/L (ref 98–110)
Creat: 0.86 mg/dL (ref 0.60–0.93)
Globulin: 2.6 g/dL (calc) (ref 1.9–3.7)
Glucose, Bld: 94 mg/dL (ref 65–99)
Potassium: 4 mmol/L (ref 3.5–5.3)
Sodium: 136 mmol/L (ref 135–146)
Total Bilirubin: 0.7 mg/dL (ref 0.2–1.2)
Total Protein: 6.8 g/dL (ref 6.1–8.1)

## 2020-10-26 LAB — LIPID PANEL
Cholesterol: 163 mg/dL (ref <200)
HDL: 55 mg/dL (ref >=50)
LDL Cholesterol (Calc): 89 mg/dL (calc)
Non-HDL Cholesterol (Calc): 108 mg/dL (calc) (ref <130)
Total CHOL/HDL Ratio: 3 (calc) (ref <5.0)
Triglycerides: 96 mg/dL (ref <150)

## 2020-10-26 LAB — HEPATITIS C ANTIBODY
Hepatitis C Ab: NONREACTIVE
SIGNAL TO CUT-OFF: 0.03 (ref <1.00)

## 2020-10-26 LAB — HEMOGLOBIN A1C
Hgb A1c MFr Bld: 5.5 % of total Hgb (ref <5.7)
Mean Plasma Glucose: 111 (calc)
eAG (mmol/L): 6.2 (calc)

## 2020-12-27 DIAGNOSIS — Z23 Encounter for immunization: Secondary | ICD-10-CM | POA: Diagnosis not present

## 2021-01-08 ENCOUNTER — Telehealth: Payer: Self-pay | Admitting: Family Medicine

## 2021-01-08 DIAGNOSIS — I10 Essential (primary) hypertension: Secondary | ICD-10-CM

## 2021-01-08 MED ORDER — LISINOPRIL-HYDROCHLOROTHIAZIDE 20-12.5 MG PO TABS: 1.0000 | ORAL_TABLET | Freq: Every day | ORAL | 3 refills | Status: DC

## 2021-01-08 NOTE — Telephone Encounter (Signed)
Refill done.  

## 2021-01-08 NOTE — Telephone Encounter (Signed)
Medication: lisinopril-hydrochlorothiazide (ZESTORETIC) 20-12.5 MG tablet [[id number]]      Has the patient contacted their pharmacy? NO (If no, request that the patient contact the pharmacy for the refill.) (If yes, when and what did the pharmacy advise?)    Preferred Pharmacy (with phone number or street name):   [place], [place] [address]  Phone:  [phone] Fax:  [phone]    Agent: Please be advised that RX refills may take up to 3 business days. We ask that you follow-up with your pharmacy.

## 2021-04-25 ENCOUNTER — Ambulatory Visit: Payer: Medicare Other | Admitting: Family Medicine

## 2021-05-08 ENCOUNTER — Other Ambulatory Visit: Payer: Self-pay | Admitting: Family Medicine

## 2021-05-08 DIAGNOSIS — F411 Generalized anxiety disorder: Secondary | ICD-10-CM

## 2021-05-22 ENCOUNTER — Ambulatory Visit (INDEPENDENT_AMBULATORY_CARE_PROVIDER_SITE_OTHER): Payer: Medicare Other | Admitting: Family Medicine

## 2021-05-22 ENCOUNTER — Encounter: Payer: Self-pay | Admitting: Family Medicine

## 2021-05-22 ENCOUNTER — Other Ambulatory Visit: Payer: Self-pay

## 2021-05-22 VITALS — BP 108/68 | HR 66 | Temp 98.4°F | Ht 61.0 in | Wt 133.1 lb

## 2021-05-22 DIAGNOSIS — F411 Generalized anxiety disorder: Secondary | ICD-10-CM | POA: Diagnosis not present

## 2021-05-22 DIAGNOSIS — I10 Essential (primary) hypertension: Secondary | ICD-10-CM | POA: Diagnosis not present

## 2021-05-22 DIAGNOSIS — E7800 Pure hypercholesterolemia, unspecified: Secondary | ICD-10-CM | POA: Diagnosis not present

## 2021-05-22 LAB — COMPREHENSIVE METABOLIC PANEL
ALT: 11 U/L (ref 0–35)
AST: 21 U/L (ref 0–37)
Albumin: 4.1 g/dL (ref 3.5–5.2)
Alkaline Phosphatase: 78 U/L (ref 39–117)
BUN: 15 mg/dL (ref 6–23)
CO2: 29 mEq/L (ref 19–32)
Calcium: 9.5 mg/dL (ref 8.4–10.5)
Chloride: 104 mEq/L (ref 96–112)
Creatinine, Ser: 0.73 mg/dL (ref 0.40–1.20)
GFR: 81.13 mL/min (ref >60.00)
Glucose, Bld: 95 mg/dL (ref 70–99)
Potassium: 3.8 mEq/L (ref 3.5–5.1)
Sodium: 140 mEq/L (ref 135–145)
Total Bilirubin: 0.6 mg/dL (ref 0.2–1.2)
Total Protein: 6.6 g/dL (ref 6.0–8.3)

## 2021-05-22 LAB — LIPID PANEL
Cholesterol: 166 mg/dL (ref 0–200)
HDL: 47.1 mg/dL (ref >39.00)
LDL Cholesterol: 98 mg/dL (ref 0–99)
NonHDL: 119.08
Total CHOL/HDL Ratio: 4
Triglycerides: 105 mg/dL (ref 0.0–149.0)
VLDL: 21 mg/dL (ref 0.0–40.0)

## 2021-05-22 NOTE — Patient Instructions (Addendum)
Give us 2-3 business days to get the results of your labs back.   Keep the diet clean and stay active.  Aim to do some physical exertion for 150 minutes per week. This is typically divided into 5 days per week, 30 minutes per day. The activity should be enough to get your heart rate up. Anything is better than nothing if you have time constraints.  Let us know if you need anything.  

## 2021-05-22 NOTE — Progress Notes (Signed)
Chief Complaint  Patient presents with  . Follow-up    Subjective Kelsey Beasley presents for f/u anxiety/depression.  Pt is currently being treated with Lexapro 20 mg/d.  No AE's, reports compliance.  No thoughts of harming self or others. No self-medication with alcohol, prescription drugs or illicit drugs. Pt is not following with a counselor/psychologist.  Hypertension Patient presents for hypertension follow up. She does not monitor home blood pressures. She is compliant with medications-Zestoretic 20-12.5 mg/d. Patient has these side effects of medication: none She is adhering to a healthy diet overall. Exercise: active at house No CP or SOB.   Hyperlipidemia Patient presents for dyslipidemia follow up. Currently being treated with pravastatin 40 mg/d and Zetia 10 mg/d and compliance with treatment thus far has been good. She denies myalgias. Diet/exercise as above.  The patient is not known to have coexisting coronary artery disease.  Past Medical History:  Diagnosis Date  . Achalasia   . Anti-D antibodies present   . Anxiety   . Blood transfusion without reported diagnosis   . Chicken pox   . Depression   . Dysplastic colon polyp   . GERD (gastroesophageal reflux disease)   . Hyperlipidemia   . Hypertension   . Osteopenia   . Prediabetes 09/15/2017  . Shingles    Allergies as of 05/22/2021   No Known Allergies     Medication List       Accurate as of May 22, 2021 11:09 AM. If you have any questions, ask your nurse or doctor.        acetaminophen 325 MG tablet Commonly known as: TYLENOL Take 650 mg by mouth every 6 (six) hours as needed (Knee pain).   diclofenac sodium 1 % Gel Commonly known as: VOLTAREN Apply topically 4 (four) times daily. Left Knee   escitalopram 20 MG tablet Commonly known as: LEXAPRO TAKE 1 TABLET BY MOUTH DAILY   ezetimibe 10 MG tablet Commonly known as: ZETIA Take 1 tablet (10 mg total) by mouth daily.   fluticasone 50  MCG/ACT nasal spray Commonly known as: FLONASE Place 2 sprays into both nostrils daily.   hydrOXYzine 25 MG tablet Commonly known as: ATARAX/VISTARIL TAKE 1 TABLET EVERY 8 HOURS AS NEEDED FOR ANXIETY   lisinopril-hydrochlorothiazide 20-12.5 MG tablet Commonly known as: ZESTORETIC Take 1 tablet by mouth daily.   MULTIVITAMIN & MINERAL PO Take by mouth.   pantoprazole 40 MG tablet Commonly known as: PROTONIX TAKE 1 TABLET DAILY   pravastatin 40 MG tablet Commonly known as: PRAVACHOL Take 1 tablet (40 mg total) by mouth daily.       Exam BP 108/68 (BP Location: Left Arm, Patient Position: Sitting, Cuff Size: Normal)   Pulse 66   Temp 98.4 F (36.9 C) (Oral)   Ht 5\' 1"  (1.549 m)   Wt 133 lb 2 oz (60.4 kg)   SpO2 98%   BMI 25.15 kg/m  General:  well developed, well nourished, in no apparent distress Heart: RRR, no bruits, no LE edema Lungs:  CTAB. No respiratory distress Psych: well oriented with normal range of affect and age-appropriate judgement/insight, alert and oriented x4.  Assessment and Plan  Generalized anxiety disorder  Essential hypertension, benign  Pure hypercholesterolemia - Plan: Comprehensive metabolic panel, Lipid panel  1. Controlled, chronic. Cont Lexapro 20 mg/d.  2. Chronic, stable. Cont Zestoretic 20-12.5 mg/d.  3. Chronic, stable. Cont Zetia 10 mg/d and pravastatin 40 mg/d. Ck labs.  F/u in 6 mo. The patient voiced understanding and agreement  to the plan.  [place], DO 05/22/21 11:09 AM

## 2021-06-04 DIAGNOSIS — M25561 Pain in right knee: Secondary | ICD-10-CM | POA: Diagnosis not present

## 2021-06-20 DIAGNOSIS — M25561 Pain in right knee: Secondary | ICD-10-CM | POA: Diagnosis not present

## 2021-09-04 ENCOUNTER — Other Ambulatory Visit: Payer: Self-pay | Admitting: Family Medicine

## 2021-09-04 DIAGNOSIS — K219 Gastro-esophageal reflux disease without esophagitis: Secondary | ICD-10-CM

## 2021-09-06 DIAGNOSIS — Z1231 Encounter for screening mammogram for malignant neoplasm of breast: Secondary | ICD-10-CM | POA: Diagnosis not present

## 2021-09-06 LAB — HM MAMMOGRAPHY

## 2021-09-10 ENCOUNTER — Encounter: Payer: Self-pay | Admitting: Family Medicine

## 2021-11-09 ENCOUNTER — Other Ambulatory Visit: Payer: Self-pay | Admitting: Family Medicine

## 2021-11-09 DIAGNOSIS — E7800 Pure hypercholesterolemia, unspecified: Secondary | ICD-10-CM

## 2021-11-26 ENCOUNTER — Ambulatory Visit (INDEPENDENT_AMBULATORY_CARE_PROVIDER_SITE_OTHER): Payer: Medicare Other | Admitting: Family Medicine

## 2021-11-26 ENCOUNTER — Encounter: Payer: Self-pay | Admitting: Family Medicine

## 2021-11-26 ENCOUNTER — Other Ambulatory Visit: Payer: Self-pay

## 2021-11-26 VITALS — BP 110/68 | HR 73 | Temp 98.3°F | Ht 61.0 in | Wt 134.5 lb

## 2021-11-26 DIAGNOSIS — I10 Essential (primary) hypertension: Secondary | ICD-10-CM | POA: Diagnosis not present

## 2021-11-26 DIAGNOSIS — F418 Other specified anxiety disorders: Secondary | ICD-10-CM

## 2021-11-26 DIAGNOSIS — K219 Gastro-esophageal reflux disease without esophagitis: Secondary | ICD-10-CM

## 2021-11-26 DIAGNOSIS — Z23 Encounter for immunization: Secondary | ICD-10-CM

## 2021-11-26 DIAGNOSIS — F411 Generalized anxiety disorder: Secondary | ICD-10-CM | POA: Diagnosis not present

## 2021-11-26 DIAGNOSIS — E7800 Pure hypercholesterolemia, unspecified: Secondary | ICD-10-CM

## 2021-11-26 MED ORDER — ESCITALOPRAM OXALATE 20 MG PO TABS
20.0000 mg | ORAL_TABLET | Freq: Every day | ORAL | 3 refills | Status: DC
Start: 2021-11-26 — End: 2022-02-12

## 2021-11-26 MED ORDER — LISINOPRIL-HYDROCHLOROTHIAZIDE 20-12.5 MG PO TABS
1.0000 | ORAL_TABLET | Freq: Every day | ORAL | 3 refills | Status: DC
Start: 2021-11-26 — End: 2022-11-27

## 2021-11-26 MED ORDER — PANTOPRAZOLE SODIUM 40 MG PO TBEC: 40.0000 mg | DELAYED_RELEASE_TABLET | Freq: Every day | ORAL | 3 refills | Status: DC

## 2021-11-26 MED ORDER — HYDROXYZINE HCL 25 MG PO TABS: ORAL_TABLET | ORAL | 2 refills | Status: DC

## 2021-11-26 MED ORDER — BUSPIRONE HCL 10 MG PO TABS
10.0000 mg | ORAL_TABLET | Freq: Two times a day (BID) | ORAL | 3 refills | Status: DC
Start: 2021-11-26 — End: 2022-11-27

## 2021-11-26 MED ORDER — PRAVASTATIN SODIUM 40 MG PO TABS
40.0000 mg | ORAL_TABLET | Freq: Every day | ORAL | 3 refills | Status: DC
Start: 2021-11-26 — End: 2022-11-27

## 2021-11-26 MED ORDER — EZETIMIBE 10 MG PO TABS
10.0000 mg | ORAL_TABLET | Freq: Every day | ORAL | 3 refills | Status: DC
Start: 2021-11-26 — End: 2022-05-28

## 2021-11-26 NOTE — Patient Instructions (Signed)
Keep the diet clean and stay active.  Aim to do some physical exertion for 150 minutes per week. This is typically divided into 5 days per week, 30 minutes per day. The activity should be enough to get your heart rate up. Anything is better than nothing if you have time constraints.  The new Shingrix vaccine (for shingles) is a 2 shot series. It can make people feel low energy, achy and almost like they have the flu for 48 hours after injection. Please plan accordingly when deciding on when to get this shot. Call our office for a nurse visit appointment to get this. The second shot of the series is less severe regarding the side effects, but it still lasts 48 hours.   Let us know if you need anything.

## 2021-11-26 NOTE — Addendum Note (Signed)
Addended by: Sharon Seller B on: 11/26/2021 09:06 AM   Modules accepted: Orders

## 2021-11-26 NOTE — Progress Notes (Signed)
Chief Complaint  Patient presents with   Follow-up    Subjective Kelsey Beasley presents for f/u anxiety/depression.  Pt is currently being treated with Lexapro 20 mg/d, BuSpar 10 mg bid.  Reports doing well since treatment. No thoughts of harming self or others. No self-medication with alcohol, prescription drugs or illicit drugs. Pt is not following with a counselor/psychologist.  Hypertension Patient presents for hypertension follow up. She does not monitor home blood pressures. She is compliant with medications. Patient has these side effects of medication: none She is adhering to a healthy diet overall. Exercise: none No Cp or SOB.   Hyperlipidemia Patient presents for dyslipidemia follow up. Currently being treated with pravastatin 40 mg/d, Zetia 10 mg/d and compliance with treatment thus far has been good. She denies myalgias. Diet/exercise as above.  The patient is not known to have coexisting coronary artery disease.  Past Medical History:  Diagnosis Date   Achalasia    Anti-D antibodies present    Anxiety    Blood transfusion without reported diagnosis    Chicken pox    Depression    Dysplastic colon polyp    GERD (gastroesophageal reflux disease)    Hyperlipidemia    Hypertension    Osteopenia    Prediabetes 09/15/2017   Shingles    Allergies as of 11/26/2021   No Known Allergies      Medication List        Accurate as of November 26, 2021  8:58 AM. If you have any questions, ask your nurse or doctor.          acetaminophen 325 MG tablet Commonly known as: TYLENOL Take 650 mg by mouth every 6 (six) hours as needed (Knee pain).   busPIRone 10 MG tablet Commonly known as: BUSPAR Take 1 tablet (10 mg total) by mouth 2 (two) times daily. Started by: Shelda Pal, DO   diclofenac sodium 1 % Gel Commonly known as: VOLTAREN Apply topically 4 (four) times daily. Left Knee   escitalopram 20 MG tablet Commonly known as: LEXAPRO Take 1  tablet (20 mg total) by mouth daily.   ezetimibe 10 MG tablet Commonly known as: ZETIA Take 1 tablet (10 mg total) by mouth daily.   fluticasone 50 MCG/ACT nasal spray Commonly known as: FLONASE Place 2 sprays into both nostrils daily.   hydrOXYzine 25 MG tablet Commonly known as: ATARAX/VISTARIL TAKE 1 TABLET EVERY 8 HOURS AS NEEDED FOR ANXIETY   lisinopril-hydrochlorothiazide 20-12.5 MG tablet Commonly known as: ZESTORETIC Take 1 tablet by mouth daily.   MULTIVITAMIN & MINERAL PO Take by mouth.   pantoprazole 40 MG tablet Commonly known as: PROTONIX Take 1 tablet (40 mg total) by mouth daily.   pravastatin 40 MG tablet Commonly known as: PRAVACHOL Take 1 tablet (40 mg total) by mouth daily.        Exam BP 110/68   Pulse 73   Temp 98.3 F (36.8 C) (Oral)   Ht 5\' 1"  (1.549 m)   Wt 134 lb 8 oz (61 kg)   SpO2 96%   BMI 25.41 kg/m  General:  well developed, well nourished, in no apparent distress Heart: RRR, no LE edema Lungs:  CTAB. No respiratory distress Psych: well oriented with normal range of affect and age-appropriate judgement/insight, alert and oriented x4.  Assessment and Plan  Generalized anxiety disorder - Plan: escitalopram (LEXAPRO) 20 MG tablet, hydrOXYzine (ATARAX/VISTARIL) 25 MG tablet  Essential hypertension, benign - Plan: lisinopril-hydrochlorothiazide (ZESTORETIC) 20-12.5 MG tablet  Pure hypercholesterolemia -  Plan: ezetimibe (ZETIA) 10 MG tablet, pravastatin (PRAVACHOL) 40 MG tablet  Gastroesophageal reflux disease without esophagitis - Plan: pantoprazole (PROTONIX) 40 MG tablet  Situational anxiety - Plan: busPIRone (BUSPAR) 10 MG tablet  Need for influenza vaccination - Plan: Flu Vaccine QUAD High Dose(Fluad)  Chronic, stable. Cont BuSpar 10 mg bid, Lexapro 20 mg/d.  Chronic, stable. Cont Prinzide 20-12.5 mg/d. Chronic, stable. Cont Zetia 10 mg/d, pravastatin 40 mg/d.  Flu shot and PCV20 today. Shingrix and covid bivalent booster  rec'd.  F/u in 6 mo or prn. The patient voiced understanding and agreement to the plan.  [place], DO 11/26/21 8:58 AM

## 2022-02-12 ENCOUNTER — Other Ambulatory Visit: Payer: Self-pay | Admitting: Family Medicine

## 2022-02-12 DIAGNOSIS — F411 Generalized anxiety disorder: Secondary | ICD-10-CM

## 2022-05-28 ENCOUNTER — Ambulatory Visit (INDEPENDENT_AMBULATORY_CARE_PROVIDER_SITE_OTHER): Payer: Medicare Other | Admitting: Family Medicine

## 2022-05-28 ENCOUNTER — Encounter: Payer: Self-pay | Admitting: Family Medicine

## 2022-05-28 VITALS — BP 120/76 | HR 69 | Temp 98.0°F | Ht 61.0 in | Wt 132.4 lb

## 2022-05-28 DIAGNOSIS — E7800 Pure hypercholesterolemia, unspecified: Secondary | ICD-10-CM | POA: Diagnosis not present

## 2022-05-28 DIAGNOSIS — I10 Essential (primary) hypertension: Secondary | ICD-10-CM

## 2022-05-28 DIAGNOSIS — Z1283 Encounter for screening for malignant neoplasm of skin: Secondary | ICD-10-CM | POA: Diagnosis not present

## 2022-05-28 DIAGNOSIS — F411 Generalized anxiety disorder: Secondary | ICD-10-CM

## 2022-05-28 LAB — LIPID PANEL
Cholesterol: 193 mg/dL (ref 0–200)
HDL: 48 mg/dL (ref >39.00)
LDL Cholesterol: 113 mg/dL — ABNORMAL HIGH (ref 0–99)
NonHDL: 144.62
Total CHOL/HDL Ratio: 4
Triglycerides: 159 mg/dL — ABNORMAL HIGH (ref 0.0–149.0)
VLDL: 31.8 mg/dL (ref 0.0–40.0)

## 2022-05-28 LAB — COMPREHENSIVE METABOLIC PANEL
ALT: 13 U/L (ref 0–35)
AST: 22 U/L (ref 0–37)
Albumin: 4.2 g/dL (ref 3.5–5.2)
Alkaline Phosphatase: 74 U/L (ref 39–117)
BUN: 12 mg/dL (ref 6–23)
CO2: 29 mEq/L (ref 19–32)
Calcium: 9.8 mg/dL (ref 8.4–10.5)
Chloride: 99 mEq/L (ref 96–112)
Creatinine, Ser: 0.77 mg/dL (ref 0.40–1.20)
GFR: 75.56 mL/min (ref >60.00)
Glucose, Bld: 100 mg/dL — ABNORMAL HIGH (ref 70–99)
Potassium: 3.9 mEq/L (ref 3.5–5.1)
Sodium: 137 mEq/L (ref 135–145)
Total Bilirubin: 0.7 mg/dL (ref 0.2–1.2)
Total Protein: 6.7 g/dL (ref 6.0–8.3)

## 2022-05-28 MED ORDER — ESCITALOPRAM OXALATE 20 MG PO TABS: 20.0000 mg | ORAL_TABLET | Freq: Every day | ORAL | 0 refills | Status: DC

## 2022-05-28 MED ORDER — ESCITALOPRAM OXALATE 20 MG PO TABS: 20.0000 mg | ORAL_TABLET | Freq: Every day | ORAL | 3 refills | Status: DC

## 2022-05-28 MED ORDER — EZETIMIBE 10 MG PO TABS: 10.0000 mg | ORAL_TABLET | Freq: Every day | ORAL | 3 refills | Status: DC

## 2022-05-28 MED ORDER — EZETIMIBE 10 MG PO TABS: 10.0000 mg | ORAL_TABLET | Freq: Every day | ORAL | 0 refills | Status: DC

## 2022-05-28 NOTE — Progress Notes (Signed)
Chief Complaint  Patient presents with   Follow-up    6 month     Subjective Kelsey Beasley presents for f/u anxiety.  Pt is currently being treated with BuSpar 10 mg bid, Lexapro 20 mg/d.  Reports doing well since treatment. No thoughts of harming self or others. No self-medication with alcohol, prescription drugs or illicit drugs. Pt is not following with a counselor/psychologist.  Hypertension Patient presents for hypertension follow up. She does not monitor home blood pressures. She is compliant with medications- Zestoretic 20-12.5 mg/d. Patient has these side effects of medication: none She is sometimes adhering to a healthy diet overall. Exercise: active at home, walking No CP or SOB.   Hyperlipidemia Patient presents for dyslipidemia follow up. Currently being treated with Pravachol 40 mg/d, Zetia 10 mg.d and compliance with treatment thus far has been good. She denies myalgias. Diet/exercise as above.  The patient is not known to have coexisting coronary artery disease.  Past Medical History:  Diagnosis Date   Achalasia    Anti-D antibodies present    Anxiety    Blood transfusion without reported diagnosis    Chicken pox    Depression    Dysplastic colon polyp    GERD (gastroesophageal reflux disease)    Hyperlipidemia    Hypertension    Osteopenia    Prediabetes 09/15/2017   Shingles    Allergies as of 05/28/2022   No Known Allergies      Medication List        Accurate as of May 28, 2022  9:11 AM. If you have any questions, ask your nurse or doctor.          acetaminophen 325 MG tablet Commonly known as: TYLENOL Take 650 mg by mouth every 6 (six) hours as needed (Knee pain).   busPIRone 10 MG tablet Commonly known as: BUSPAR Take 1 tablet (10 mg total) by mouth 2 (two) times daily.   diclofenac sodium 1 % Gel Commonly known as: VOLTAREN Apply topically 4 (four) times daily. Left Knee   escitalopram 20 MG tablet Commonly known as:  LEXAPRO Take 1 tablet (20 mg total) by mouth daily.   ezetimibe 10 MG tablet Commonly known as: ZETIA Take 1 tablet (10 mg total) by mouth daily.   fluticasone 50 MCG/ACT nasal spray Commonly known as: FLONASE Place 2 sprays into both nostrils daily.   hydrOXYzine 25 MG tablet Commonly known as: ATARAX TAKE 1 TABLET EVERY 8 HOURS AS NEEDED FOR ANXIETY   lisinopril-hydrochlorothiazide 20-12.5 MG tablet Commonly known as: ZESTORETIC Take 1 tablet by mouth daily.   MULTIVITAMIN & MINERAL PO Take by mouth.   pantoprazole 40 MG tablet Commonly known as: PROTONIX Take 1 tablet (40 mg total) by mouth daily.   pravastatin 40 MG tablet Commonly known as: PRAVACHOL Take 1 tablet (40 mg total) by mouth daily.        Exam BP 120/76   Pulse 69   Temp 98 F (36.7 C) (Oral)   Ht '5\' 1"'$  (1.549 m)   Wt 132 lb 6 oz (60 kg)   SpO2 95%   BMI 25.01 kg/m  General:  well developed, well nourished, in no apparent distress Heart: RRR, no bruits, no LE edema Lungs: CTAB. No respiratory distress Psych: well oriented with normal range of affect and age-appropriate judgement/insight, alert and oriented x4.  Assessment and Plan  Generalized anxiety disorder - Plan: escitalopram (LEXAPRO) 20 MG tablet, DISCONTINUED: escitalopram (LEXAPRO) 20 MG tablet  Essential hypertension, benign  Pure hypercholesterolemia - Plan: Comprehensive metabolic panel, Lipid panel, ezetimibe (ZETIA) 10 MG tablet, DISCONTINUED: ezetimibe (ZETIA) 10 MG tablet  Skin cancer screening - Plan: Ambulatory referral to Dermatology  Chronic, stable. Cont Lexapro 20 mg/d, BuSpar 10 mg bid.  Chronic, stable. Cont Zestoretic 20-12.5 mg/d. Counseled on diet/exercise.  Chronic, stable. Cont Zetia 10 mg/d, pravastatin 40 mg/d.  Would like to see derm for yearly skin checks, will place referral.  F/u in 6 mo. The patient voiced understanding and agreement to the plan.  [place], DO 05/28/22 9:11 AM

## 2022-05-28 NOTE — Patient Instructions (Signed)
Give us 2-3 business days to get the results of your labs back.   Keep the diet clean and stay active.  If you do not hear anything about your referral in the next 1-2 weeks, call our office and ask for an update.  Let us know if you need anything. 

## 2022-05-31 ENCOUNTER — Telehealth: Payer: Self-pay | Admitting: Family Medicine

## 2022-05-31 NOTE — Telephone Encounter (Signed)
Pt would like referral for dermatology sent to [facility] center at [facility]. Please advise

## 2022-06-17 DIAGNOSIS — Z135 Encounter for screening for eye and ear disorders: Secondary | ICD-10-CM | POA: Diagnosis not present

## 2022-06-17 DIAGNOSIS — H2513 Age-related nuclear cataract, bilateral: Secondary | ICD-10-CM | POA: Diagnosis not present

## 2022-06-17 DIAGNOSIS — H524 Presbyopia: Secondary | ICD-10-CM | POA: Diagnosis not present

## 2022-08-13 DIAGNOSIS — N3000 Acute cystitis without hematuria: Secondary | ICD-10-CM | POA: Diagnosis not present

## 2022-08-13 DIAGNOSIS — R300 Dysuria: Secondary | ICD-10-CM | POA: Diagnosis not present

## 2022-08-14 DIAGNOSIS — L821 Other seborrheic keratosis: Secondary | ICD-10-CM | POA: Diagnosis not present

## 2022-08-14 DIAGNOSIS — L578 Other skin changes due to chronic exposure to nonionizing radiation: Secondary | ICD-10-CM | POA: Diagnosis not present

## 2022-08-14 DIAGNOSIS — D225 Melanocytic nevi of trunk: Secondary | ICD-10-CM | POA: Diagnosis not present

## 2022-08-14 DIAGNOSIS — L570 Actinic keratosis: Secondary | ICD-10-CM | POA: Diagnosis not present

## 2022-09-04 DIAGNOSIS — R300 Dysuria: Secondary | ICD-10-CM | POA: Diagnosis not present

## 2022-09-04 DIAGNOSIS — N760 Acute vaginitis: Secondary | ICD-10-CM | POA: Diagnosis not present

## 2022-09-04 DIAGNOSIS — N3000 Acute cystitis without hematuria: Secondary | ICD-10-CM | POA: Diagnosis not present

## 2022-09-09 DIAGNOSIS — Z1231 Encounter for screening mammogram for malignant neoplasm of breast: Secondary | ICD-10-CM | POA: Diagnosis not present

## 2022-09-09 LAB — HM MAMMOGRAPHY

## 2022-09-11 ENCOUNTER — Encounter: Payer: Self-pay | Admitting: Family Medicine

## 2022-11-05 ENCOUNTER — Ambulatory Visit: Payer: Medicare Other | Admitting: Family Medicine

## 2022-11-06 ENCOUNTER — Encounter: Payer: Self-pay | Admitting: Family Medicine

## 2022-11-06 ENCOUNTER — Ambulatory Visit (INDEPENDENT_AMBULATORY_CARE_PROVIDER_SITE_OTHER): Payer: Medicare Other | Admitting: Family Medicine

## 2022-11-06 VITALS — BP 110/80 | HR 74 | Temp 98.4°F | Ht 61.0 in | Wt 130.0 lb

## 2022-11-06 DIAGNOSIS — M25512 Pain in left shoulder: Secondary | ICD-10-CM | POA: Diagnosis not present

## 2022-11-06 DIAGNOSIS — N3000 Acute cystitis without hematuria: Secondary | ICD-10-CM | POA: Diagnosis not present

## 2022-11-06 DIAGNOSIS — G8929 Other chronic pain: Secondary | ICD-10-CM | POA: Diagnosis not present

## 2022-11-06 MED ORDER — NITROFURANTOIN MONOHYD MACRO 100 MG PO CAPS: 100.0000 mg | ORAL_CAPSULE | Freq: Two times a day (BID) | ORAL | 0 refills | Status: AC

## 2022-11-06 MED ORDER — MELOXICAM 15 MG PO TABS: 15.0000 mg | ORAL_TABLET | Freq: Every day | ORAL | 0 refills | Status: DC

## 2022-11-06 NOTE — Patient Instructions (Addendum)
Ice/cold pack over area for 10-15 min twice daily.  Heat (pad or rice pillow in microwave) over affected area, 10-15 minutes twice daily.   OK to take Tylenol 1000 mg (2 extra strength tabs) or 975 mg (3 regular strength tabs) every 6 hours as needed.  Stay hydrated.   Warning signs/symptoms: Uncontrollable nausea/vomiting, fevers, worsening symptoms despite treatment, confusion.  Give [place] around 2 business days to get culture back to you.  Let us know if you need anything.  EXERCISES  RANGE OF MOTION (ROM) AND STRETCHING EXERCISES These exercises may help you when beginning to rehabilitate your injury. While completing these exercises, remember:  Restoring tissue flexibility helps normal motion to return to the joints. This allows healthier, less painful movement and activity. An effective stretch should be held for at least 30 seconds. A stretch should never be painful. You should only feel a gentle lengthening or release in the stretched tissue.  ROM - Pendulum Bend at the waist so that your right / left arm falls away from your body. Support yourself with your opposite hand on a solid surface, such as a table or a countertop. Your right / left arm should be perpendicular to the ground. If it is not perpendicular, you need to lean over farther. Relax the muscles in your right / left arm and shoulder as much as possible. Gently sway your hips and trunk so they move your right / left arm without any use of your right / left shoulder muscles. Progress your movements so that your right / left arm moves side to side, then forward and backward, and finally, both clockwise and counterclockwise. Complete 10-15 repetitions in each direction. Many people use this exercise to relieve discomfort in their shoulder as well as to gain range of motion. Repeat 2 times. Complete this exercise 3 times per week.  STRETCH - Flexion, Standing Stand with good posture. With an underhand grip on your right /  left hand and an overhand grip on the opposite hand, grasp a broomstick or cane so that your hands are a little more than shoulder-width apart. Keeping your right / left elbow straight and shoulder muscles relaxed, push the stick with your opposite hand to raise your right / left arm in front of your body and then overhead. Raise your arm until you feel a stretch in your right / left shoulder, but before you have increased shoulder pain. Try to avoid shrugging your right / left shoulder as your arm rises by keeping your shoulder blade tucked down and toward your mid-back spine. Hold 30 seconds. Slowly return to the starting position. Repeat 2 times. Complete this exercise 3 times per week.  STRETCH - Internal Rotation Place your right / left hand behind your back, palm-up. Throw a towel or belt over your opposite shoulder. Grasp the towel/belt with your right / left hand. While keeping an upright posture, gently pull up on the towel/belt until you feel a stretch in the front of your right / left shoulder. Avoid shrugging your right / left shoulder as your arm rises by keeping your shoulder blade tucked down and toward your mid-back spine. Hold 30. Release the stretch by lowering your opposite hand. Repeat 2 times. Complete this exercise 3 times per week.  STRETCH - External Rotation and Abduction Stagger your stance through a doorframe. It does not matter which foot is forward. As instructed by your physician, physical therapist or athletic trainer, place your hands: And forearms above your head and  on the door frame. And forearms at head-height and on the door frame. At elbow-height and on the door frame. Keeping your head and chest upright and your stomach muscles tight to prevent over-extending your low-back, slowly shift your weight onto your front foot until you feel a stretch across your chest and/or in the front of your shoulders. Hold 30 seconds. Shift your weight to your back foot to  release the stretch. Repeat 2 times. Complete this stretch 3 times per week.   STRENGTHENING EXERCISES  These exercises may help you when beginning to rehabilitate your injury. They may resolve your symptoms with or without further involvement from your physician, physical therapist or athletic trainer. While completing these exercises, remember:  Muscles can gain both the endurance and the strength needed for everyday activities through controlled exercises. Complete these exercises as instructed by your physician, physical therapist or athletic trainer. Progress the resistance and repetitions only as guided. You may experience muscle soreness or fatigue, but the pain or discomfort you are trying to eliminate should never worsen during these exercises. If this pain does worsen, stop and make certain you are following the directions exactly. If the pain is still present after adjustments, discontinue the exercise until you can discuss the trouble with your clinician. If advised by your physician, during your recovery, avoid activity or exercises which involve actions that place your right / left hand or elbow above your head or behind your back or head. These positions stress the tissues which are trying to heal.  STRENGTH - Scapular Depression and Adduction With good posture, sit on a firm chair. Supported your arms in front of you with pillows, arm rests or a table top. Have your elbows in line with the sides of your body. Gently draw your shoulder blades down and toward your mid-back spine. Gradually increase the tension without tensing the muscles along the top of your shoulders and the back of your neck. Hold for 3 seconds. Slowly release the tension and relax your muscles completely before completing the next repetition. After you have practiced this exercise, remove the arm support and complete it in standing as well as sitting. Repeat 2 times. Complete this exercise 3 times per week.    STRENGTH - External Rotators Secure a rubber exercise band/tubing to a fixed object so that it is at the same height as your right / left elbow when you are standing or sitting on a firm surface. Stand or sit so that the secured exercise band/tubing is at your side that is not injured. Bend your elbow 90 degrees. Place a folded towel or small pillow under your right / left arm so that your elbow is a few inches away from your side. Keeping the tension on the exercise band/tubing, pull it away from your body, as if pivoting on your elbow. Be sure to keep your body steady so that the movement is only coming from your shoulder rotating. Hold 3 seconds. Release the tension in a controlled manner as you return to the starting position. Repeat 2 times. Complete this exercise 3 times per week.   STRENGTH - Supraspinatus Stand or sit with good posture. Grasp a 2-3 lb weight or an exercise band/tubing so that your hand is "thumbs-up," like when you shake hands. Slowly lift your right / left hand from your thigh into the air, traveling about 30 degrees from straight out at your side. Lift your hand to shoulder height or as far as you can without increasing  any shoulder pain. Initially, many people do not lift their hands above shoulder height. Avoid shrugging your right / left shoulder as your arm rises by keeping your shoulder blade tucked down and toward your mid-back spine. Hold for 3 seconds. Control the descent of your hand as you slowly return to your starting position. Repeat 2 times. Complete this exercise 3 times per week.   STRENGTH - Shoulder Extensors Secure a rubber exercise band/tubing so that it is at the height of your shoulders when you are either standing or sitting on a firm arm-less chair. With a thumbs-up grip, grasp an end of the band/tubing in each hand. Straighten your elbows and lift your hands straight in front of you at shoulder height. Step back away from the secured end of  band/tubing until it becomes tense. Squeezing your shoulder blades together, pull your hands down to the sides of your thighs. Do not allow your hands to go behind you. Hold for 3 seconds. Slowly ease the tension on the band/tubing as you reverse the directions and return to the starting position. Repeat 2 times. Complete this exercise 3 times per week.   STRENGTH - Scapular Retractors Secure a rubber exercise band/tubing so that it is at the height of your shoulders when you are either standing or sitting on a firm arm-less chair. With a palm-down grip, grasp an end of the band/tubing in each hand. Straighten your elbows and lift your hands straight in front of you at shoulder height. Step back away from the secured end of band/tubing until it becomes tense. Squeezing your shoulder blades together, draw your elbows back as you bend them. Keep your upper arm lifted away from your body throughout the exercise. Hold 3 seconds. Slowly ease the tension on the band/tubing as you reverse the directions and return to the starting position. Repeat 2 times. Complete this exercise 3 times per week.  STRENGTH - Scapular Depressors Find a sturdy chair without wheels, such as a from a dining room table. Keeping your feet on the floor, lift your bottom from the seat and lock your elbows. Keeping your elbows straight, allow gravity to pull your body weight down. Your shoulders will rise toward your ears. Raise your body against gravity by drawing your shoulder blades down your back, shortening the distance between your shoulders and ears. Although your feet should always maintain contact with the floor, your feet should progressively support less body weight as you get stronger. Hold 3 seconds. In a controlled and slow manner, lower your body weight to begin the next repetition. Repeat 2 times. Complete this exercise 3 times per week.    This information is not intended to replace advice given to you by your  health care provider. Make sure you discuss any questions you have with your health care provider.   Document Released: 10/30/2005 Document Revised: 01/06/2015 Document Reviewed: 03/30/2009 Elsevier Interactive Patient Education [employer].

## 2022-11-06 NOTE — Progress Notes (Signed)
Musculoskeletal Exam  Patient: Kelsey Beasley DOB: [birth year]  DOS: 11/06/2022  SUBJECTIVE:  Chief Complaint:   Chief Complaint  Patient presents with   Arm Pain    Left arm    multiple UTI"s recently    Kelsey Beasley is a 76 y.o.  female for evaluation and treatment of L shoulder pain.   Onset:  4 months ago. No inj or change in activity.  Location: L shoulder region Character:  aching  Progression of issue:  is unchanged Associated symptoms: decreased ROm No bruising, redness, swelling, catching/locking Treatment: to date has been Tylenol, NSAIDs, Salon pas, topical menthol, Voltaren gel.   Neurovascular symptoms: no  3-4 mo of intermittent urinary s/s's.  She has hesitancy, retention, urgency, frequency, and dysuria.  She denies any fevers, nausea, vomiting, abdominal pain, constipation, trauma, flank pain, discharge, or bleeding.  She had 2 culture proven infections, one in September and 1 in August.  She was placed on antibiotics.  After the second infection, she was placed on 10 days of an antibiotic which she does not recall the name of, and finally improved.  This lasted for several days before symptoms returned.  Past Medical History:  Diagnosis Date   Achalasia    Anti-D antibodies present    Anxiety    Blood transfusion without reported diagnosis    Chicken pox    Depression    Dysplastic colon polyp    GERD (gastroesophageal reflux disease)    Hyperlipidemia    Hypertension    Osteopenia    Prediabetes 09/15/2017   Shingles     Objective: VITAL SIGNS: BP 110/80 (BP Location: Left Arm, Patient Position: Sitting, Cuff Size: Normal)   Pulse 74   Temp 98.4 F (36.9 C) (Oral)   Ht '5\' 1"'$  (1.549 m)   Wt 130 lb (59 kg)   SpO2 95%   BMI 24.56 kg/m  Constitutional: Well formed, well developed. No acute distress. Thorax & Lungs: Clear to auscultation bilaterally no accessory muscle use Heart: RRR Abd: Bowel sounds present, soft, TTP over the suprapubic region, no  masses or organomegaly, nondistended Musculoskeletal: L shoulder.   Normal active range of motion: no.   Normal passive range of motion: no Tenderness to palpation: no Deformity: no Ecchymosis: no Tests positive: Neer's, Hawkins, empty can Tests negative: Speeds, liftoff, crossover, O'Briens Neurologic: Normal sensory function. No focal deficits noted. DTR's equal and symmetric in UE's. No clonus. Psychiatric: Normal mood. Age appropriate judgment and insight. Alert & oriented x 3.    Assessment:  Chronic left shoulder pain - Plan: meloxicam (MOBIC) 15 MG tablet  Acute cystitis without hematuria - Plan: nitrofurantoin, macrocrystal-monohydrate, (MACROBID) 100 MG capsule, Urine Culture  Plan: Chronic, unstable.  Suspect supraspinatus injury.  Daily Mobic as needed, stretches/exercises, heat, ice, Tylenol.  Seems to be recurrent.  We will treat with 7 days of Macrobid today and check a culture. F/u as originally scheduled at the end of the month, would consider injection versus physical therapy for #1 and if the culture is positive again we will have her start Premarin cream. The patient voiced understanding and agreement to the plan.   [place], DO 11/06/22  10:18 AM

## 2022-11-09 LAB — URINE CULTURE
MICRO NUMBER:: 14161162
SPECIMEN QUALITY:: ADEQUATE

## 2022-11-11 ENCOUNTER — Encounter: Payer: Self-pay | Admitting: Family Medicine

## 2022-11-27 ENCOUNTER — Ambulatory Visit (INDEPENDENT_AMBULATORY_CARE_PROVIDER_SITE_OTHER): Payer: Medicare Other | Admitting: Family Medicine

## 2022-11-27 ENCOUNTER — Encounter: Payer: Self-pay | Admitting: Family Medicine

## 2022-11-27 VITALS — BP 122/76 | HR 70 | Temp 97.6°F | Ht 61.0 in | Wt 127.4 lb

## 2022-11-27 DIAGNOSIS — F418 Other specified anxiety disorders: Secondary | ICD-10-CM | POA: Diagnosis not present

## 2022-11-27 DIAGNOSIS — I10 Essential (primary) hypertension: Secondary | ICD-10-CM

## 2022-11-27 DIAGNOSIS — F411 Generalized anxiety disorder: Secondary | ICD-10-CM

## 2022-11-27 DIAGNOSIS — Z23 Encounter for immunization: Secondary | ICD-10-CM

## 2022-11-27 DIAGNOSIS — E7800 Pure hypercholesterolemia, unspecified: Secondary | ICD-10-CM | POA: Diagnosis not present

## 2022-11-27 MED ORDER — PREMARIN 0.625 MG/GM VA CREA: 1.0000 | TOPICAL_CREAM | Freq: Every day | VAGINAL | 12 refills | Status: DC

## 2022-11-27 MED ORDER — BUSPIRONE HCL 10 MG PO TABS: 10.0000 mg | ORAL_TABLET | Freq: Two times a day (BID) | ORAL | 3 refills | Status: DC

## 2022-11-27 MED ORDER — ESCITALOPRAM OXALATE 20 MG PO TABS: 20.0000 mg | ORAL_TABLET | Freq: Every day | ORAL | 3 refills | Status: DC

## 2022-11-27 MED ORDER — LISINOPRIL-HYDROCHLOROTHIAZIDE 20-12.5 MG PO TABS: 1.0000 | ORAL_TABLET | Freq: Every day | ORAL | 3 refills | Status: DC

## 2022-11-27 MED ORDER — PRAVASTATIN SODIUM 40 MG PO TABS: 40.0000 mg | ORAL_TABLET | Freq: Every day | ORAL | 3 refills | Status: DC

## 2022-11-27 NOTE — Progress Notes (Signed)
Chief Complaint  Patient presents with   Follow-up    Subjective Kelsey Beasley is a 76 y.o. female who presents for hypertension follow up. She does monitor home blood pressures. Blood pressures ranging from 120's/70's on average. She is compliant with medications- Zestoretic 20-12.5 mg/d. Patient has these side effects of medication: none She is adhering to a healthy diet overall. Current exercise: none lately No Cp or SOB.  Hyperlipidemia Patient presents for dyslipidemia follow up. Currently being treated with pravastatin 40 mg/d, Zetia 10 mg/d and compliance with treatment thus far has been good. She denies myalgias. Diet/exercise as above.  The patient is not known to have coexisting coronary artery disease.  Anxiety Taking Lexapro 20 mg/d, BuSpar 10 mg bid.  Doing well overall.  Reports compliance, no AE's. No SI or HI. No self-medication. She is not following with a counselor/psychologist.    Past Medical History:  Diagnosis Date   Achalasia    Anti-D antibodies present    Anxiety    Blood transfusion without reported diagnosis    Chicken pox    Depression    Dysplastic colon polyp    GERD (gastroesophageal reflux disease)    Hyperlipidemia    Hypertension    Osteopenia    Prediabetes 09/15/2017   Shingles     Exam BP 122/76 (BP Location: Left Arm, Patient Position: Sitting, Cuff Size: Normal)   Pulse 70   Temp 97.6 F (36.4 C) (Oral)   Ht '5\' 1"'$  (1.549 m)   Wt 127 lb 6 oz (57.8 kg)   SpO2 95%   BMI 24.07 kg/m  General:  well developed, well nourished, in no apparent distress Heart: RRR, no bruits, no LE edema Lungs: clear to auscultation, no accessory muscle use Psych: well oriented with normal range of affect and appropriate judgment/insight  Essential hypertension, benign - Plan: lisinopril-hydrochlorothiazide (ZESTORETIC) 20-12.5 MG tablet  Pure hypercholesterolemia - Plan: pravastatin (PRAVACHOL) 40 MG tablet  Generalized anxiety disorder -  Plan: escitalopram (LEXAPRO) 20 MG tablet  Situational anxiety - Plan: busPIRone (BUSPAR) 10 MG tablet  Chronic, stable. Cont Zestoretic 20-12.5 mg/d. Counseled on diet and exercise. Chronic, stable. Cont pravastatin 40 mg/d, Zetia 10 mg/d.  Chronic, stable. Cont Lexapro 20 mg/d, BuSpar 10 mg bid.  Flu shot today. Shingrix rec'd.  F/u in 6 mo or prn. The patient voiced understanding and agreement to the plan.  [place], DO 11/27/22  9:14 AM

## 2022-11-27 NOTE — Addendum Note (Signed)
Addended by: Sharon Seller B on: 11/27/2022 09:20 AM   Modules accepted: Orders

## 2022-11-27 NOTE — Patient Instructions (Addendum)
Keep the diet clean and stay active.  Aim to do some physical exertion for 150 minutes per week. This is typically divided into 5 days per week, 30 minutes per day. The activity should be enough to get your heart rate up. Anything is better than nothing if you have time constraints.  The Shingrix vaccine (for shingles) is a 2 shot series spaced 2-6 months apart. It can make people feel low energy, achy and almost like they have the flu for 48 hours after injection. 1/5 people can have nausea and/or vomiting. Please plan accordingly when deciding on when to get this shot. Call our office for a nurse visit appointment to get this. The second shot of the series is less severe regarding the side effects, but it still lasts 48 hours.   Let us know if you need anything.

## 2022-12-03 ENCOUNTER — Other Ambulatory Visit: Payer: Self-pay | Admitting: Family Medicine

## 2022-12-03 DIAGNOSIS — G8929 Other chronic pain: Secondary | ICD-10-CM

## 2023-03-28 ENCOUNTER — Other Ambulatory Visit: Payer: Self-pay | Admitting: Family Medicine

## 2023-03-28 DIAGNOSIS — K219 Gastro-esophageal reflux disease without esophagitis: Secondary | ICD-10-CM

## 2023-05-28 ENCOUNTER — Other Ambulatory Visit: Payer: Self-pay | Admitting: Family Medicine

## 2023-05-28 ENCOUNTER — Ambulatory Visit (INDEPENDENT_AMBULATORY_CARE_PROVIDER_SITE_OTHER): Payer: Medicare Other | Admitting: Family Medicine

## 2023-05-28 ENCOUNTER — Encounter: Payer: Self-pay | Admitting: Family Medicine

## 2023-05-28 VITALS — BP 128/80 | HR 72 | Temp 98.6°F | Ht 61.0 in | Wt 126.5 lb

## 2023-05-28 DIAGNOSIS — E871 Hypo-osmolality and hyponatremia: Secondary | ICD-10-CM

## 2023-05-28 DIAGNOSIS — E7800 Pure hypercholesterolemia, unspecified: Secondary | ICD-10-CM | POA: Diagnosis not present

## 2023-05-28 DIAGNOSIS — F411 Generalized anxiety disorder: Secondary | ICD-10-CM | POA: Diagnosis not present

## 2023-05-28 DIAGNOSIS — R42 Dizziness and giddiness: Secondary | ICD-10-CM

## 2023-05-28 DIAGNOSIS — I10 Essential (primary) hypertension: Secondary | ICD-10-CM | POA: Diagnosis not present

## 2023-05-28 LAB — LIPID PANEL
Cholesterol: 203 mg/dL — ABNORMAL HIGH (ref 0–200)
HDL: 52.6 mg/dL (ref >39.00)
LDL Cholesterol: 124 mg/dL — ABNORMAL HIGH (ref 0–99)
NonHDL: 149.97
Total CHOL/HDL Ratio: 4
Triglycerides: 131 mg/dL (ref 0.0–149.0)
VLDL: 26.2 mg/dL (ref 0.0–40.0)

## 2023-05-28 LAB — COMPREHENSIVE METABOLIC PANEL
ALT: 13 U/L (ref 0–35)
AST: 22 U/L (ref 0–37)
Albumin: 4.1 g/dL (ref 3.5–5.2)
Alkaline Phosphatase: 75 U/L (ref 39–117)
BUN: 9 mg/dL (ref 6–23)
CO2: 31 mEq/L (ref 19–32)
Calcium: 9.6 mg/dL (ref 8.4–10.5)
Chloride: 96 mEq/L (ref 96–112)
Creatinine, Ser: 0.71 mg/dL (ref 0.40–1.20)
GFR: 82.71 mL/min (ref >60.00)
Glucose, Bld: 98 mg/dL (ref 70–99)
Potassium: 3.8 mEq/L (ref 3.5–5.1)
Sodium: 134 mEq/L — ABNORMAL LOW (ref 135–145)
Total Bilirubin: 0.8 mg/dL (ref 0.2–1.2)
Total Protein: 7 g/dL (ref 6.0–8.3)

## 2023-05-28 NOTE — Patient Instructions (Addendum)
Give [place] 2-3 business days to get the results of your labs back.   Keep the diet clean and stay active.  Call Center for [facility] at [phone] for an appointment.  They are located at [address], [address], [place], [place], [address] (right across the hall from our office).  I suspect some level of orthostasis so get up slowly. Stay hydrated. Consider adding something with electrolytes like Gatorade or Powerade (Pedialyte if you can stomach the taste). If you continue having issues, please let me know.   Let us know if you need anything.

## 2023-05-28 NOTE — Progress Notes (Signed)
Chief Complaint  Patient presents with   Follow-up    6 month    Fall    Subjective Kelsey Beasley presents for f/u anxiety.  Pt is currently being treated with BuSpar 10 mg bid, Lexapro 20 mg/d.  Reports doing well since treatment. No thoughts of harming self or others. No self-medication with alcohol, prescription drugs or illicit drugs. Pt is not following with a counselor/psychologist.  Hypertension Patient presents for hypertension follow up. She does not monitor home blood pressures. She is compliant with medications- Zestoretic 20-12.5 mg/d. Patient has these side effects of medication: none She is adhering to a healthy diet overall. Exercise: walking No Cp or SOB.   Hyperlipidemia Patient presents for hyperlipidemia follow up. Currently being treated with pravastatin 40 mg/d, Zetia 10 mg/d and compliance with treatment thus far has been good. She denies myalgias. Diet/exercise as above.  The patient is not known to have coexisting coronary artery disease.  Fall 2 weeks ago the patient was getting out of her car after coming home from church and felt lightheaded.  She fell to the ground and knocked her lens out of her glasses.  She did not hit her head hard or lose consciousness.  She is felt intermittent lightheadedness when she stands up since then.  She is eating and drinking normally.  No nausea, vomiting, diarrhea, bleeding, intermittent lightheadedness when she stands up since then.  She is eating and drinking normally.  No nausea, vomiting, diarrhea, bleeding,  Past Medical History:  Diagnosis Date   Achalasia    Anti-D antibodies present    Anxiety    Blood transfusion without reported diagnosis    Chicken pox    Depression    Dysplastic colon polyp    GERD (gastroesophageal reflux disease)    Hyperlipidemia    Hypertension    Osteopenia    Prediabetes 09/15/2017   Shingles    Allergies as of 05/28/2023   No Known Allergies      Medication List         Accurate as of May 28, 2023 11:11 AM. If you have any questions, ask your nurse or doctor.          STOP taking these medications    Premarin vaginal cream Generic drug: conjugated estrogens Stopped by: Sharlene Dory, DO       TAKE these medications    acetaminophen 325 MG tablet Commonly known as: TYLENOL Take 650 mg by mouth every 6 (six) hours as needed (Knee pain).   busPIRone 10 MG tablet Commonly known as: BUSPAR Take 1 tablet (10 mg total) by mouth 2 (two) times daily.   diclofenac sodium 1 % Gel Commonly known as: VOLTAREN Apply topically 4 (four) times daily. Left Knee   escitalopram 20 MG tablet Commonly known as: LEXAPRO Take 1 tablet (20 mg total) by mouth daily.   ezetimibe 10 MG tablet Commonly known as: ZETIA Take 1 tablet (10 mg total) by mouth daily.   fluticasone 50 MCG/ACT nasal spray Commonly known as: FLONASE Place 2 sprays into both nostrils daily.   hydrOXYzine 25 MG tablet Commonly known as: ATARAX TAKE 1 TABLET EVERY 8 HOURS AS NEEDED FOR ANXIETY   lisinopril-hydrochlorothiazide 20-12.5 MG tablet Commonly known as: ZESTORETIC Take 1 tablet by mouth daily.   meloxicam 15 MG tablet Commonly known as: MOBIC TAKE 1 TABLET(15 MG) BY MOUTH DAILY   MULTIVITAMIN & MINERAL PO Take by mouth.   pravastatin 40 MG tablet Commonly known as: PRAVACHOL  Take 1 tablet (40 mg total) by mouth daily.        Exam BP 128/80 (BP Location: Left Arm, Patient Position: Standing, Cuff Size: Normal)   Pulse 72   Temp 98.6 F (37 C) (Oral)   Ht 5\' 1"  (1.549 m)   Wt 126 lb 8 oz (57.4 kg)   SpO2 97%   BMI 23.90 kg/m  General:  well developed, well nourished, in no apparent distress Heart: RRR, no bruits, no LE edema Lungs:  CTAB. No respiratory distress Psych: well oriented with normal range of affect and age-appropriate judgement/insight, alert and oriented x4.  Assessment and Plan  Essential hypertension,  benign  Generalized anxiety disorder  Pure hypercholesterolemia - Plan: Comprehensive metabolic panel, Lipid panel  Lightheadedness  Chronic, stable. Cont Zestoretic 20-12.5 mg/d.  Chronic, stable. Cont BuSpar 10 mg bid, Lexapro 20 mg/d.  Chronic, stable. Cont pravastatin 40 mg/d, Zetia 10 mg/d. Orthostatics suggestive of orthostatic hypotension when rising from laying ot sitting but then increased again from sitting to standing. Will increase hydration, salt intake and consider compression stockings. Refer to cards if no better in next 3-4 weeks.  F/u in 6 mo. The patient voiced understanding and agreement to the plan.  Kelsey Beasley [place], DO 05/28/23 11:11 AM

## 2023-06-06 ENCOUNTER — Other Ambulatory Visit: Payer: Self-pay | Admitting: Family Medicine

## 2023-06-06 ENCOUNTER — Other Ambulatory Visit: Payer: Medicare Other

## 2023-06-06 DIAGNOSIS — E7800 Pure hypercholesterolemia, unspecified: Secondary | ICD-10-CM

## 2023-06-18 DIAGNOSIS — H524 Presbyopia: Secondary | ICD-10-CM | POA: Diagnosis not present

## 2023-06-18 DIAGNOSIS — H2513 Age-related nuclear cataract, bilateral: Secondary | ICD-10-CM | POA: Diagnosis not present

## 2023-06-18 DIAGNOSIS — Z135 Encounter for screening for eye and ear disorders: Secondary | ICD-10-CM | POA: Diagnosis not present

## 2023-08-11 DIAGNOSIS — R1314 Dysphagia, pharyngoesophageal phase: Secondary | ICD-10-CM | POA: Diagnosis not present

## 2023-08-11 DIAGNOSIS — K220 Achalasia of cardia: Secondary | ICD-10-CM | POA: Diagnosis not present

## 2023-08-11 DIAGNOSIS — K219 Gastro-esophageal reflux disease without esophagitis: Secondary | ICD-10-CM | POA: Diagnosis not present

## 2023-08-22 DIAGNOSIS — K317 Polyp of stomach and duodenum: Secondary | ICD-10-CM | POA: Diagnosis not present

## 2023-08-22 DIAGNOSIS — K449 Diaphragmatic hernia without obstruction or gangrene: Secondary | ICD-10-CM | POA: Diagnosis not present

## 2023-08-22 DIAGNOSIS — K222 Esophageal obstruction: Secondary | ICD-10-CM | POA: Diagnosis not present

## 2023-08-22 DIAGNOSIS — K3189 Other diseases of stomach and duodenum: Secondary | ICD-10-CM | POA: Diagnosis not present

## 2023-08-22 DIAGNOSIS — K219 Gastro-esophageal reflux disease without esophagitis: Secondary | ICD-10-CM | POA: Diagnosis not present

## 2023-08-22 DIAGNOSIS — Z9889 Other specified postprocedural states: Secondary | ICD-10-CM | POA: Diagnosis not present

## 2023-08-22 DIAGNOSIS — K2950 Unspecified chronic gastritis without bleeding: Secondary | ICD-10-CM | POA: Diagnosis not present

## 2023-08-22 DIAGNOSIS — R1314 Dysphagia, pharyngoesophageal phase: Secondary | ICD-10-CM | POA: Diagnosis not present

## 2023-10-20 DIAGNOSIS — Z8719 Personal history of other diseases of the digestive system: Secondary | ICD-10-CM | POA: Diagnosis not present

## 2023-10-20 DIAGNOSIS — K449 Diaphragmatic hernia without obstruction or gangrene: Secondary | ICD-10-CM | POA: Diagnosis not present

## 2023-10-20 DIAGNOSIS — K222 Esophageal obstruction: Secondary | ICD-10-CM | POA: Diagnosis not present

## 2023-10-20 DIAGNOSIS — K2210 Ulcer of esophagus without bleeding: Secondary | ICD-10-CM | POA: Diagnosis not present

## 2023-10-20 DIAGNOSIS — K317 Polyp of stomach and duodenum: Secondary | ICD-10-CM | POA: Diagnosis not present

## 2023-11-03 DIAGNOSIS — Z1231 Encounter for screening mammogram for malignant neoplasm of breast: Secondary | ICD-10-CM | POA: Diagnosis not present

## 2023-11-03 LAB — HM MAMMOGRAPHY

## 2023-12-19 ENCOUNTER — Other Ambulatory Visit: Payer: Self-pay | Admitting: Family Medicine

## 2023-12-19 DIAGNOSIS — F411 Generalized anxiety disorder: Secondary | ICD-10-CM

## 2023-12-19 DIAGNOSIS — I10 Essential (primary) hypertension: Secondary | ICD-10-CM

## 2024-02-06 DIAGNOSIS — K2210 Ulcer of esophagus without bleeding: Secondary | ICD-10-CM | POA: Diagnosis not present

## 2024-02-06 DIAGNOSIS — Z8719 Personal history of other diseases of the digestive system: Secondary | ICD-10-CM | POA: Diagnosis not present

## 2024-02-06 DIAGNOSIS — K2270 Barrett's esophagus without dysplasia: Secondary | ICD-10-CM | POA: Diagnosis not present

## 2024-02-06 DIAGNOSIS — K449 Diaphragmatic hernia without obstruction or gangrene: Secondary | ICD-10-CM | POA: Diagnosis not present

## 2024-02-06 DIAGNOSIS — K317 Polyp of stomach and duodenum: Secondary | ICD-10-CM | POA: Diagnosis not present

## 2024-03-12 ENCOUNTER — Other Ambulatory Visit: Payer: Self-pay | Admitting: Family Medicine

## 2024-03-12 DIAGNOSIS — I10 Essential (primary) hypertension: Secondary | ICD-10-CM

## 2024-04-02 ENCOUNTER — Ambulatory Visit: Admitting: Family Medicine

## 2024-04-05 ENCOUNTER — Encounter: Payer: Self-pay | Admitting: Family Medicine

## 2024-04-05 ENCOUNTER — Ambulatory Visit (INDEPENDENT_AMBULATORY_CARE_PROVIDER_SITE_OTHER): Admitting: Family Medicine

## 2024-04-05 VITALS — BP 129/74 | HR 84 | Temp 98.4°F | Resp 16 | Ht 61.0 in | Wt 126.0 lb

## 2024-04-05 DIAGNOSIS — E7800 Pure hypercholesterolemia, unspecified: Secondary | ICD-10-CM

## 2024-04-05 DIAGNOSIS — R7303 Prediabetes: Secondary | ICD-10-CM | POA: Diagnosis not present

## 2024-04-05 DIAGNOSIS — F418 Other specified anxiety disorders: Secondary | ICD-10-CM | POA: Diagnosis not present

## 2024-04-05 DIAGNOSIS — F411 Generalized anxiety disorder: Secondary | ICD-10-CM | POA: Diagnosis not present

## 2024-04-05 DIAGNOSIS — I10 Essential (primary) hypertension: Secondary | ICD-10-CM | POA: Diagnosis not present

## 2024-04-05 DIAGNOSIS — R300 Dysuria: Secondary | ICD-10-CM

## 2024-04-05 LAB — COMPREHENSIVE METABOLIC PANEL WITH GFR
ALT: 13 U/L (ref 0–35)
AST: 22 U/L (ref 0–37)
Albumin: 4.2 g/dL (ref 3.5–5.2)
Alkaline Phosphatase: 86 U/L (ref 39–117)
BUN: 11 mg/dL (ref 6–23)
CO2: 28 meq/L (ref 19–32)
Calcium: 9.4 mg/dL (ref 8.4–10.5)
Chloride: 101 meq/L (ref 96–112)
Creatinine, Ser: 0.79 mg/dL (ref 0.40–1.20)
GFR: 72.32 mL/min (ref >60.00)
Glucose, Bld: 97 mg/dL (ref 70–99)
Potassium: 3.6 meq/L (ref 3.5–5.1)
Sodium: 138 meq/L (ref 135–145)
Total Bilirubin: 0.5 mg/dL (ref 0.2–1.2)
Total Protein: 6.8 g/dL (ref 6.0–8.3)

## 2024-04-05 LAB — LIPID PANEL
Cholesterol: 190 mg/dL (ref 0–200)
HDL: 55.1 mg/dL (ref >39.00)
LDL Cholesterol: 119 mg/dL — ABNORMAL HIGH (ref 0–99)
NonHDL: 134.66
Total CHOL/HDL Ratio: 3
Triglycerides: 77 mg/dL (ref 0.0–149.0)
VLDL: 15.4 mg/dL (ref 0.0–40.0)

## 2024-04-05 LAB — URINALYSIS, ROUTINE W REFLEX MICROSCOPIC
Bilirubin Urine: NEGATIVE
Ketones, ur: NEGATIVE
Nitrite: POSITIVE — AB
Specific Gravity, Urine: 1.01 (ref 1.000–1.030)
Total Protein, Urine: NEGATIVE
Urine Glucose: NEGATIVE
Urobilinogen, UA: 0.2 (ref 0.0–1.0)
pH: 7.5 (ref 5.0–8.0)

## 2024-04-05 LAB — HEMOGLOBIN A1C: Hgb A1c MFr Bld: 5.5 % (ref 4.6–6.5)

## 2024-04-05 MED ORDER — ESCITALOPRAM OXALATE 20 MG PO TABS: 20.0000 mg | ORAL_TABLET | Freq: Every day | ORAL | 0 refills | Status: DC

## 2024-04-05 MED ORDER — BUSPIRONE HCL 10 MG PO TABS
10.0000 mg | ORAL_TABLET | Freq: Two times a day (BID) | ORAL | 1 refills | Status: AC
Start: 2024-04-05 — End: ?

## 2024-04-05 MED ORDER — ESCITALOPRAM OXALATE 20 MG PO TABS: 20.0000 mg | ORAL_TABLET | Freq: Every day | ORAL | 1 refills | Status: DC

## 2024-04-05 MED ORDER — EZETIMIBE 10 MG PO TABS: 10.0000 mg | ORAL_TABLET | Freq: Every day | ORAL | 1 refills | Status: AC

## 2024-04-05 MED ORDER — PRAVASTATIN SODIUM 40 MG PO TABS
40.0000 mg | ORAL_TABLET | Freq: Every day | ORAL | 1 refills | Status: DC
Start: 2024-04-05 — End: 2024-09-09

## 2024-04-05 NOTE — Patient Instructions (Addendum)
 Give [place] 2-3 business days to get the results of your labs back.   Keep the diet clean and stay active.  Please consider adding some weight resistance exercise to your routine. Consider yoga as well.   Take at least 1000 units of vitamin D3 daily.   Please get me a copy of your advanced directive form at your convenience.   Please consider counseling. Contact [phone] to schedule an appointment or inquire about cost/insurance coverage.  [facility] located at [address], [address], [place], [place].  Phone number = [phone].  Dr. Regan Lemming - Adult Psychiatry.    [facility] located at [address] [place], [place], [place]. Phone number = [phone].   [facility] located at [address] [place], [place].  Phone number = [phone].   [facility] located at [address] [place], [place], [place].  Phone number = [phone].  Let us know if you need anything.

## 2024-04-05 NOTE — Progress Notes (Signed)
 Chief Complaint  Patient presents with   Hypertension    Here for follow up   Anxiety    Patient reports increased anxiety, daughter diagnosed with ALS    Subjective Kelsey Beasley is a 78 y.o. female who presents for hypertension follow up. She does not monitor home blood pressures. She is compliant with medications-Zestoretic 20-12.5 mg daily. Patient has these side effects of medication: none She is sometimes adhering to a healthy diet overall. Current exercise: Walking No CP or SOB.   Hyperlipidemia Patient presents for dyslipidemia follow up. Currently being treated with pravastatin 40 mg daily, Zetia 10 mg daily and compliance with treatment thus far has been good. She denies myalgias. Diet/exercise as above. The patient is not known to have coexisting coronary artery disease.  GAD Pt is currently being treated with Lexapro 20 mg daily, BuSpar 10 mg twice daily.  Reports doing well since treatment. Daughter dx'd w ALS- struggling with that.  No thoughts of harming self or others. No self-medication with alcohol, prescription drugs or illicit drugs. Pt is not following with a counselor/psychologist.   Past Medical History:  Diagnosis Date   Achalasia    Anti-D antibodies present    Anxiety    Blood transfusion without reported diagnosis    Chicken pox    Depression    Dysplastic colon polyp    GERD (gastroesophageal reflux disease)    Hyperlipidemia    Hypertension    Osteopenia    Prediabetes 09/15/2017   Shingles     Exam BP 129/74 (BP Location: Right Arm, Patient Position: Sitting, Cuff Size: Small)   Pulse 84   Temp 98.4 F (36.9 C) (Oral)   Resp 16   Ht 5\' 1"  (1.549 m)   Wt 126 lb (57.2 kg)   SpO2 98%   BMI 23.81 kg/m  General:  well developed, well nourished, in no apparent distress Heart: RRR, no bruits, no LE edema Lungs: clear to auscultation, no accessory muscle use Psych: well oriented with normal range of affect and appropriate  judgment/insight  Essential hypertension, benign  Pure hypercholesterolemia - Plan: Comprehensive metabolic panel with GFR, Lipid panel  Generalized anxiety disorder  Prediabetes - Plan: Hemoglobin A1c  Chronic, stable.  Continue Zestoretic 20-12.5 mg daily.  Counseled on diet and exercise. Chronic, stable.  Continue pravastatin 40 mg daily. Chronic, somewhat stable.  Continue Lexapro 20 mg daily, BuSpar 10 mg twice daily. Counseling info provided.  F/u in 6 months. The patient voiced understanding and agreement to the plan.  [name] Cumings, DO 04/05/24  7:56 AM

## 2024-04-06 ENCOUNTER — Encounter: Payer: Self-pay | Admitting: Family Medicine

## 2024-04-07 ENCOUNTER — Other Ambulatory Visit: Payer: Self-pay | Admitting: Family Medicine

## 2024-04-07 MED ORDER — CEPHALEXIN 500 MG PO CAPS
500.0000 mg | ORAL_CAPSULE | Freq: Three times a day (TID) | ORAL | 0 refills | Status: AC
Start: 2024-04-07 — End: 2024-04-14

## 2024-04-08 LAB — URINE CULTURE
MICRO NUMBER:: 16296909
SPECIMEN QUALITY:: ADEQUATE

## 2024-04-09 ENCOUNTER — Other Ambulatory Visit: Payer: Self-pay | Admitting: Family Medicine

## 2024-04-09 DIAGNOSIS — F411 Generalized anxiety disorder: Secondary | ICD-10-CM

## 2024-04-09 MED ORDER — HYDROXYZINE HCL 25 MG PO TABS: 12.5000 mg | ORAL_TABLET | Freq: Three times a day (TID) | ORAL | 2 refills | Status: AC | PRN

## 2024-05-03 ENCOUNTER — Other Ambulatory Visit: Payer: Self-pay | Admitting: Family Medicine

## 2024-05-03 DIAGNOSIS — F411 Generalized anxiety disorder: Secondary | ICD-10-CM

## 2024-05-14 DIAGNOSIS — Z79899 Other long term (current) drug therapy: Secondary | ICD-10-CM | POA: Diagnosis not present

## 2024-05-14 DIAGNOSIS — K2270 Barrett's esophagus without dysplasia: Secondary | ICD-10-CM | POA: Diagnosis not present

## 2024-05-14 DIAGNOSIS — K9089 Other intestinal malabsorption: Secondary | ICD-10-CM | POA: Diagnosis not present

## 2024-05-14 DIAGNOSIS — Z5181 Encounter for therapeutic drug level monitoring: Secondary | ICD-10-CM | POA: Diagnosis not present

## 2024-05-14 DIAGNOSIS — K219 Gastro-esophageal reflux disease without esophagitis: Secondary | ICD-10-CM | POA: Diagnosis not present

## 2024-05-14 DIAGNOSIS — K222 Esophageal obstruction: Secondary | ICD-10-CM | POA: Diagnosis not present

## 2024-06-29 DIAGNOSIS — Z135 Encounter for screening for eye and ear disorders: Secondary | ICD-10-CM | POA: Diagnosis not present

## 2024-06-29 DIAGNOSIS — H2513 Age-related nuclear cataract, bilateral: Secondary | ICD-10-CM | POA: Diagnosis not present

## 2024-06-29 DIAGNOSIS — H524 Presbyopia: Secondary | ICD-10-CM | POA: Diagnosis not present

## 2024-09-09 ENCOUNTER — Ambulatory Visit (INDEPENDENT_AMBULATORY_CARE_PROVIDER_SITE_OTHER): Admitting: *Deleted

## 2024-09-09 VITALS — Ht 61.0 in | Wt 125.0 lb

## 2024-09-09 DIAGNOSIS — E7800 Pure hypercholesterolemia, unspecified: Secondary | ICD-10-CM | POA: Diagnosis not present

## 2024-09-09 DIAGNOSIS — Z0000 Encounter for general adult medical examination without abnormal findings: Secondary | ICD-10-CM | POA: Diagnosis not present

## 2024-09-09 DIAGNOSIS — Z1211 Encounter for screening for malignant neoplasm of colon: Secondary | ICD-10-CM

## 2024-09-09 DIAGNOSIS — Z780 Asymptomatic menopausal state: Secondary | ICD-10-CM | POA: Diagnosis not present

## 2024-09-09 MED ORDER — PRAVASTATIN SODIUM 40 MG PO TABS: 40.0000 mg | ORAL_TABLET | Freq: Every day | ORAL | 1 refills | Status: AC

## 2024-09-09 NOTE — Progress Notes (Signed)
 Please attest this visit in the absence of patient primary care provider.    Subjective:   Kelsey Beasley is a 78 y.o. who presents for a Medicare Wellness preventive visit.  As a reminder, Annual Wellness Visits don't include a physical exam, and some assessments may be limited, especially if this visit is performed virtually. We may recommend an in-person follow-up visit with your provider if needed.  Visit Complete: Virtual I connected with  Kelsey Beasley on 09/09/24 by a audio enabled telemedicine application and verified that I am speaking with the correct person using two identifiers.  Patient Location: Home  Provider Location: Office/Clinic  I discussed the limitations of evaluation and management by telemedicine. The patient expressed understanding and agreed to proceed.  Vital Signs: Because this visit was a virtual/telehealth visit, some criteria may be missing or patient reported. Any vitals not documented were not able to be obtained and vitals that have been documented are patient reported.  VideoDeclined- This patient declined Librarian, academic. Therefore the visit was completed with audio only.  Persons Participating in Visit: Patient.  AWV Questionnaire: No: Patient Medicare AWV questionnaire was not completed prior to this visit.  Cardiac Risk Factors include: advanced age (>59men, >1 women);dyslipidemia;hypertension     Objective:    Today's Vitals   09/09/24 1104  Weight: 125 lb (56.7 kg)  Height: 5' 1 (1.549 m)   Body mass index is 23.62 kg/m.     09/09/2024   11:13 AM  Advanced Directives  Does Patient Have a Medical Advance Directive? Yes  Type of Estate agent of [place];Living will  Does patient want to make changes to medical advance directive? No - Patient declined  Copy of Healthcare Power of Attorney in Chart? No - copy requested    Current Medications (verified) Outpatient Encounter  Medications as of 09/09/2024  Medication Sig   busPIRone  (BUSPAR ) 10 MG tablet Take 1 tablet (10 mg total) by mouth 2 (two) times daily.   diclofenac sodium (VOLTAREN) 1 % GEL Apply topically 4 (four) times daily. Left Knee   escitalopram  (LEXAPRO ) 20 MG tablet TAKE 1 TABLET(20 MG) BY MOUTH DAILY   ezetimibe  (ZETIA ) 10 MG tablet Take 1 tablet (10 mg total) by mouth daily.   hydrOXYzine  (ATARAX ) 25 MG tablet Take 0.5-1 tablets (12.5-25 mg total) by mouth every 8 (eight) hours as needed for anxiety. TAKE 1 TABLET EVERY 8 HOURS AS NEEDED FOR ANXIETY   lisinopril -hydrochlorothiazide  (ZESTORETIC ) 20-12.5 MG tablet TAKE 1 TABLET DAILY (NEED FURTHER EVALUATION AND/OR LAB TESTING BEFORE FURTHER REFILLS ARE GIVEN, MAKE AN APPOINTMENT)   Multiple Vitamins-Minerals (MULTIVITAMIN & MINERAL PO) Take by mouth.   pravastatin  (PRAVACHOL ) 40 MG tablet Take 1 tablet (40 mg total) by mouth daily.   [DISCONTINUED] pravastatin  (PRAVACHOL ) 40 MG tablet Take 1 tablet (40 mg total) by mouth daily.   [DISCONTINUED] fluticasone  (FLONASE ) 50 MCG/ACT nasal spray Place 2 sprays into both nostrils daily. (Patient not taking: Reported on 09/09/2024)   No facility-administered encounter medications on file as of 09/09/2024.    Allergies (verified) Patient has no known allergies.   History: Past Medical History:  Diagnosis Date   Achalasia    Anti-D antibodies present    Anxiety    Blood transfusion without reported diagnosis    Chicken pox    Depression    Dysplastic colon polyp    GERD (gastroesophageal reflux disease)    Hyperlipidemia    Hypertension    Osteopenia  Prediabetes 09/15/2017   Shingles    Past Surgical History:  Procedure Laterality Date   COLON SURGERY     THROAT SURGERY     WRIST SURGERY     Family History  Problem Relation Age of Onset   Hyperlipidemia Mother        Living   Hypertension Mother    COPD Father    Lung cancer Father 65       Deceased   Hyperlipidemia Sister     Hypertension Sister    COPD Sister    Alcohol abuse Sister        x2   Liver disease Sister    Throat cancer Brother    Cancer Brother        throat cancer died age 26   ALS Daughter    Bipolar disorder Daughter    Healthy Son        x2   Diabetes Maternal Grandmother    Heart disease Paternal Grandmother    Heart disease Paternal Grandfather    Heart attack Neg Hx    Sudden death Neg Hx    Social History   Socioeconomic History   Marital status: Widowed    Spouse name: Not on file   Number of children: Not on file   Years of education: Not on file   Highest education level: Not on file  Occupational History   Not on file  Tobacco Use   Smoking status: Never   Smokeless tobacco: Never  Substance and Sexual Activity   Alcohol use: No   Drug use: No   Sexual activity: Not on file  Other Topics Concern   Not on file  Social History Narrative   Marital Status:  Married Sydnee)    Children:  G3 [id number]   Pets:  None    Living Situation: Lives with spouse, daughter and grandson.     Occupation: Optometrist   Drug Use:  None   Diet:  Regular   Exercise:  [place]; Walking    Hobbies:  Reading , Bowling, Grandchildren    [Tobacco: Never smoker]   Social Drivers of Corporate investment banker Strain: Low Risk  (09/09/2024)   Overall Financial Resource Strain (CARDIA)    Difficulty of Paying Living Expenses: Not very hard  Food Insecurity: No Food Insecurity (09/09/2024)   Hunger Vital Sign    Worried About Running Out of Food in the Last Year: Never true    Ran Out of Food in the Last Year: Never true  Transportation Needs: No Transportation Needs (09/09/2024)   PRAPARE - Administrator, Civil Service (Medical): No    Lack of Transportation (Non-Medical): No  Physical Activity: Inactive (09/09/2024)   Exercise Vital Sign    Days of Exercise per Week: 0 days    Minutes of Exercise per Session: 0 min  Stress: No Stress  Concern Present (09/09/2024)   [employer] of Occupational Health - Occupational Stress Questionnaire    Feeling of Stress: Not at all  Social Connections: Moderately Integrated (09/09/2024)   Social Connection and Isolation Panel    Frequency of Communication with Friends and Family: More than three times a week    Frequency of Social Gatherings with Friends and Family: More than three times a week    Attends Religious Services: More than 4 times per year    Active Member of [employer] or Organizations: Yes    Attends [employer]  or Organization Meetings: 1 to 4 times per year    Marital Status: Widowed    Tobacco Counseling Counseling given: Not Answered    Clinical Intake:  Pre-visit preparation completed: Yes  Pain : No/denies pain     BMI - recorded: 23.62 Nutritional Status: BMI of 19-24  Normal Nutritional Risks: None Diabetes: No  Lab Results  Component Value Date   HGBA1C 5.5 04/05/2024   HGBA1C 5.5 10/25/2020   HGBA1C 6.0 01/05/2020     How often do you need to have someone help you when you read instructions, pamphlets, or other written materials from your doctor or pharmacy?: 1 - Never What is the last grade level you completed in school?: 12  Interpreter Needed?: No  Information entered by :: Siana Panameno, CMA(AAMA)   Activities of Daily Living     09/09/2024   11:07 AM  In your present state of health, do you have any difficulty performing the following activities:  Hearing? 0  Vision? 0  Difficulty concentrating or making decisions? 0  Walking or climbing stairs? 0  Dressing or bathing? 0  Doing errands, shopping? 0  Preparing Food and eating ? N  Using the Toilet? N  In the past six months, have you accidently leaked urine? N  Do you have problems with loss of bowel control? N  Managing your Medications? N  Managing your Finances? N  Housekeeping or managing your Housekeeping? N    Patient Care Team: Frann Mabel Mt, DO as PCP -  General (Family Medicine) Marvis Ronal BIRCH., MD as Consulting Physician (Gastroenterology) [facility] (Optometry)  I have updated your Care Teams any recent Medical Services you may have received from other providers in the past year.     Assessment:   This is a routine wellness examination for Tele[place].  Hearing/Vision screen Hearing Screening - Comments:: Denies hearing difficulties.  Vision Screening - Comments:: Up to date with routine eye exams with [facility] [place]    Goals Addressed               This Visit's Progress     Patient Stated (pt-stated)        To maintain an active lifestyle       Depression Screen     09/09/2024   11:13 AM 05/28/2023    9:02 AM 05/28/2022    8:52 AM 05/22/2021   10:46 AM 06/11/2017   10:48 AM 04/03/2016    4:06 PM 09/08/2013    9:38 AM  PHQ 2/9 Scores  PHQ - 2 Score 0 0 0 0 5 0 2  PHQ- 9 Score 0 0 0 0   3    Fall Risk     09/09/2024   11:09 AM 05/28/2023    9:01 AM 05/28/2022    8:51 AM 05/22/2021   10:46 AM 07/29/2019    9:33 AM  Fall Risk   Falls in the past year? 1 1 1  0 0   Comment fell out of bed    Emmi Telephone Survey: data to providers prior to load   Number falls in past yr: 0 0 0 0   Injury with Fall? 0 1 1 0   Comment  hip and leg bruised. hit the side of the tub.  went to ortho and wore a brace.    Risk for fall due to :  No Fall Risks No Fall Risks No Fall Risks   Follow up Education provided Falls evaluation completed Falls evaluation completed  Falls evaluation completed       Data saved with a previous flowsheet row definition    MEDICARE RISK AT HOME:  Medicare Risk at Home Any stairs in or around the home?: No If so, are there any without handrails?: No Home free of loose throw rugs in walkways, pet beds, electrical cords, etc?: Yes Adequate lighting in your home to reduce risk of falls?: Yes Life alert?: No Use of a cane, walker or w/c?: No Grab bars in the bathroom?: No Shower chair or bench in shower?:  No Elevated toilet seat or a handicapped toilet?: No  TIMED UP AND GO:  Was the test performed?  No,audio  Cognitive Function: 6CIT completed        09/09/2024   11:14 AM  6CIT Screen  What Year? 0 points  What month? 0 points  What time? 0 points  Count back from 20 0 points  Months in reverse 4 points  Repeat phrase 2 points  Total Score 6 points    Immunizations Immunization History  Administered Date(s) Administered   Fluad Quad(high Dose 65+) 12/22/2019, 10/25/2020, 11/26/2021, 11/27/2022   INFLUENZA, HIGH DOSE SEASONAL PF 12/21/2015, 11/20/2016, 09/15/2017, 09/25/2018   Influenza,inj,Quad PF,6+ Mos 09/08/2013, 09/12/2014   Janssen (J&J) SARS-COV-2 Vaccination 08/01/2020, 12/28/2020   PNEUMOCOCCAL CONJUGATE-20 11/26/2021   Pneumococcal Conjugate-13 04/04/2015   Pneumococcal-Unspecified 02/03/2013   Td 09/15/2017   Tdap 04/29/2007   Zoster, Live 01/02/2009    Screening Tests Health Maintenance  Topic Date Due   Medicare Annual Wellness (AWV)  06/11/2018   Influenza Vaccine  07/30/2024   Colonoscopy  10/25/2024   DTaP/Tdap/Td (3 - Td or Tdap) 09/16/2027   Pneumococcal Vaccine: 50+ Years  Completed   DEXA SCAN  Completed   Hepatitis C Screening  Completed   HPV VACCINES  Aged Out   Meningococcal B Vaccine  Aged Out   Mammogram  Discontinued   COVID-19 Vaccine  Discontinued   Zoster Vaccines- Shingrix  Discontinued    Health Maintenance Items Addressed: Will get flu vaccine this fall. Colon referral placed. DEXA ordered.  Additional Screening:  Vision Screening: Recommended annual ophthalmology exams for early detection of glaucoma and other disorders of the eye. Is the patient up to date with their annual eye exam?  Yes  Who is the provider or what is the name of the office in which the patient attends annual eye exams? [facility], [place]  Dental Screening: Recommended annual dental exams for proper oral hygiene  Community Resource Referral /  Chronic Care Management: CRR required this visit?  No   CCM required this visit?  No   Plan:    I have personally reviewed and noted the following in the patient's chart:   Medical and social history Use of alcohol, tobacco or illicit drugs  Current medications and supplements including opioid prescriptions. Patient is not currently taking opioid prescriptions. Functional ability and status Nutritional status Physical activity Advanced directives List of other physicians Hospitalizations, surgeries, and ER visits in previous 12 months Vitals Screenings to include cognitive, depression, and falls Referrals and appointments  In addition, I have reviewed and discussed with patient certain preventive protocols, quality metrics, and best practice recommendations. A written personalized care plan for preventive services as well as general preventive health recommendations were provided to patient.   Lolita Libra, CMA   09/09/2024   After Visit Summary: (MyChart) Due to this being a telephonic visit, the after visit summary with patients personalized plan was offered to patient  via MyChart   Notes: Nothing significant to report at this time.

## 2024-09-09 NOTE — Patient Instructions (Addendum)
 Ms. Schiller , Thank you for taking time out of your busy schedule to complete your Annual Wellness Visit with me. I enjoyed our conversation and look forward to speaking with you again next year. I, as well as your care team,  appreciate your ongoing commitment to your health goals. Please review the following plan we discussed and let me know if I can assist you in the future. Your Game plan/ To Do List   Referrals: If you haven't heard from the office you've been referred to, please reach out to them at the phone provided.   [facility] ([facility]):  [phone]  Follow up Visits: Next Medicare AWV with our clinical staff: 09/15/25 8:20, telephone    Next Office Visit with your provider: 10/01/24 9:15, Dr Frann. You can get your flu vaccine at this visit if not already received.  Clinician Recommendations:  Aim for 30 minutes of exercise or brisk walking, 6-8 glasses of water, and 5 servings of fruits and vegetables each day.       This is a list of the screening recommended for you and due dates:  Health Maintenance  Topic Date Due   Medicare Annual Wellness Visit  06/11/2018   Flu Shot  07/30/2024   Colon Cancer Screening  10/25/2024   DTaP/Tdap/Td vaccine (3 - Td or Tdap) 09/16/2027   Pneumococcal Vaccine for age over 93  Completed   DEXA scan (bone density measurement)  Completed   Hepatitis C Screening  Completed   HPV Vaccine  Aged Out   Meningitis B Vaccine  Aged Out   Breast Cancer Screening  Discontinued   COVID-19 Vaccine  Discontinued   Zoster (Shingles) Vaccine  Discontinued    Advanced directives: (Copy Requested) Please bring a copy of your health care power of attorney and living will to the office to be added to your chart at your convenience. You can mail to [facility] [address] [place], [place] [address] or email to [email or web address] Advance Care Planning is important because it:  [x]  Makes sure you receive the medical  care that is consistent with your values, goals, and preferences  [x]  It provides guidance to your family and loved ones and reduces their decisional burden about whether or not they are making the right decisions based on your wishes.  Follow the link provided in your after visit summary or read over the paperwork we have mailed to you to help you started getting your Advance Directives in place. If you need assistance in completing these, please reach out to us  so that we can help you!  See attachments for Preventive Care and Fall Prevention Tips.

## 2024-09-27 ENCOUNTER — Telehealth: Payer: Self-pay

## 2024-09-27 NOTE — Telephone Encounter (Signed)
 Copied from CRM #[id number]. Topic: General - Other >> Sep 27, 2024 10:39 AM Aleatha C wrote: Reason for CRM: Patient would like to know when she is due for her colonoscopy

## 2024-09-27 NOTE — Telephone Encounter (Signed)
 10/25/24.  Does she need a referral?

## 2024-09-28 NOTE — Telephone Encounter (Signed)
 Referral has been placed and pt was advised of GI referral.

## 2024-10-01 ENCOUNTER — Ambulatory Visit: Admitting: Family Medicine

## 2024-10-04 ENCOUNTER — Other Ambulatory Visit: Payer: Self-pay | Admitting: Family Medicine

## 2024-10-04 DIAGNOSIS — F411 Generalized anxiety disorder: Secondary | ICD-10-CM

## 2025-09-15 ENCOUNTER — Ambulatory Visit
